# Patient Record
Sex: Female | Born: 2000 | Race: Black or African American | Hispanic: No | Marital: Single | Smoking: Never smoker
Health system: Southern US, Community
[De-identification: ages and names within clinical notes are randomized; demographics above are authoritative.]

---

## 2021-06-14 ENCOUNTER — Observation Stay: Admission: AD | Admit: 2021-06-14 | Payer: Self-pay | Admitting: Internal Medicine

## 2021-06-15 ENCOUNTER — Observation Stay (HOSPITAL_COMMUNITY): Payer: Medicaid Other

## 2021-06-15 ENCOUNTER — Other Ambulatory Visit: Payer: Self-pay

## 2021-06-15 ENCOUNTER — Observation Stay (HOSPITAL_COMMUNITY)
Admission: AD | Admit: 2021-06-15 | Discharge: 2021-06-16 | Disposition: A | Discharge status: Home / Self Care | Payer: Medicaid Other | Source: Other Acute Inpatient Hospital | Attending: Family Medicine | Admitting: Family Medicine

## 2021-06-15 ENCOUNTER — Encounter (HOSPITAL_COMMUNITY): Payer: Self-pay | Admitting: Internal Medicine

## 2021-06-15 DIAGNOSIS — K8020 Calculus of gallbladder without cholecystitis without obstruction: Principal | ICD-10-CM | POA: Insufficient documentation

## 2021-06-15 DIAGNOSIS — K831 Obstruction of bile duct: Secondary | ICD-10-CM | POA: Diagnosis present

## 2021-06-15 LAB — CBC WITH DIFFERENTIAL/PLATELET
Abs Immature Granulocytes: 0.01 10*3/uL (ref 0.00–0.07)
Basophils Absolute: 0 10*3/uL (ref 0.0–0.1)
Basophils Relative: 0 %
Eosinophils Absolute: 0.1 10*3/uL (ref 0.0–0.5)
Eosinophils Relative: 1 %
HCT: 36.2 % (ref 36.0–46.0)
Hemoglobin: 11.9 g/dL — ABNORMAL LOW (ref 12.0–15.0)
Immature Granulocytes: 0 %
Lymphocytes Relative: 29 %
Lymphs Abs: 1.4 10*3/uL (ref 0.7–4.0)
MCH: 26.9 pg (ref 26.0–34.0)
MCHC: 32.9 g/dL (ref 30.0–36.0)
MCV: 81.9 fL (ref 80.0–100.0)
Monocytes Absolute: 0.4 10*3/uL (ref 0.1–1.0)
Monocytes Relative: 8 %
Neutro Abs: 3 10*3/uL (ref 1.7–7.7)
Neutrophils Relative %: 62 %
Platelets: 293 10*3/uL (ref 150–400)
RBC: 4.42 MIL/uL (ref 3.87–5.11)
RDW: 13.7 % (ref 11.5–15.5)
WBC: 4.9 10*3/uL (ref 4.0–10.5)
nRBC: 0 % (ref 0.0–0.2)

## 2021-06-15 LAB — PROTIME-INR
INR: 1.1 (ref 0.8–1.2)
Prothrombin Time: 14.5 seconds (ref 11.4–15.2)

## 2021-06-15 LAB — HEPATITIS PANEL, ACUTE
HCV Ab: NONREACTIVE
Hep A IgM: NONREACTIVE
Hep B C IgM: NONREACTIVE
Hepatitis B Surface Ag: NONREACTIVE

## 2021-06-15 LAB — LIPASE, BLOOD: Lipase: 52 U/L — ABNORMAL HIGH (ref 11–51)

## 2021-06-15 LAB — COMPREHENSIVE METABOLIC PANEL
ALT: 178 U/L — ABNORMAL HIGH (ref 0–44)
AST: 113 U/L — ABNORMAL HIGH (ref 15–41)
Albumin: 3.4 g/dL — ABNORMAL LOW (ref 3.5–5.0)
Alkaline Phosphatase: 136 U/L — ABNORMAL HIGH (ref 38–126)
Anion gap: 8 (ref 5–15)
BUN: 8 mg/dL (ref 6–20)
CO2: 21 mmol/L — ABNORMAL LOW (ref 22–32)
Calcium: 8.5 mg/dL — ABNORMAL LOW (ref 8.9–10.3)
Chloride: 107 mmol/L (ref 98–111)
Creatinine, Ser: 0.65 mg/dL (ref 0.44–1.00)
GFR, Estimated: >60 mL/min (ref >60)
Glucose, Bld: 87 mg/dL (ref 70–99)
Potassium: 3.5 mmol/L (ref 3.5–5.1)
Sodium: 136 mmol/L (ref 135–145)
Total Bilirubin: 4 mg/dL — ABNORMAL HIGH (ref 0.3–1.2)
Total Protein: 6.5 g/dL (ref 6.5–8.1)

## 2021-06-15 LAB — HIV ANTIBODY (ROUTINE TESTING W REFLEX): HIV Screen 4th Generation wRfx: NONREACTIVE

## 2021-06-15 IMAGING — MR MR ABDOMEN WO/W CM MRCP
17 of 19 series · 42 of 48 positions shown · IV contrast (gadavist)
Comparison: CT of yesterday from [HOSPITAL] [facility]

CLINICAL DATA: Jaundiced

EXAM:
MRI ABDOMEN WITHOUT AND WITH CONTRAST (INCLUDING MRCP)
TECHNIQUE: Multiplanar multisequence MR imaging of the abdomen was performed
both before and after the administration of intravenous contrast.
Heavily T2-weighted images of the biliary and pancreatic ducts were
obtained, and three-dimensional MRCP images were rendered by post
processing.
CONTRAST:  7mL GADAVIST GADOBUTROL 1 MMOL/ML IV SOLN

[Series 3: T2 fat-sat · axial · 6.0mm · 1.25mm/px · z∈[-69,+183]mm · 2 of 36 slices shown]
[im 1/36]
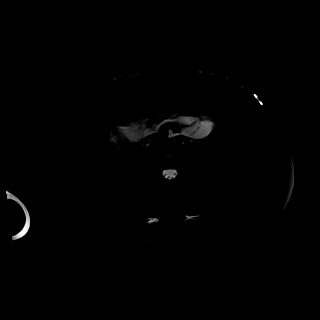
[im 36/36]
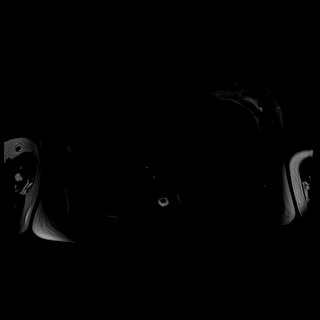

[Series 6: T2 · coronal · 6.0mm · 1.48mm/px · 2 of 28 slices shown (1 of 2)]
[im 1/28]
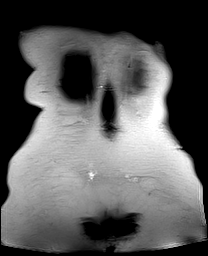
[im 28/28]
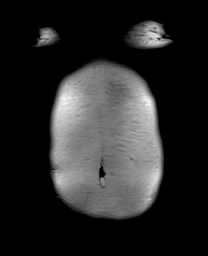

[Series 8: DWI · axial · 6.0mm · 1.49mm/px · z∈[-66,+186]mm · 3 of 72 slices shown (1 of 2)]
[im 1/72]
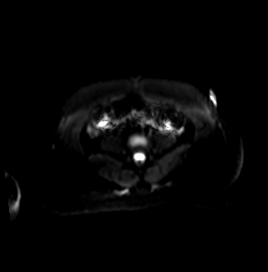
[im 36/72]
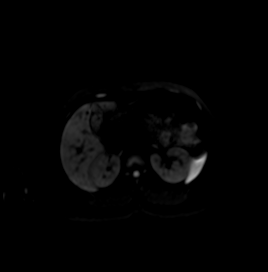
[im 72/72]
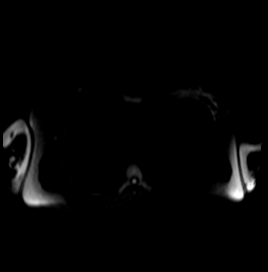

[Series 9: DWI · axial · 6.0mm · 1.49mm/px · 1 of 36 slices shown (2 of 2)]
[im 1/36]
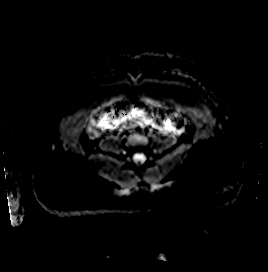

[Series 10: T1 · axial · 3.0mm · 1.25mm/px · z∈[-71,+142]mm · 3 of 72 slices shown (1 of 2)]
[im 1/72]
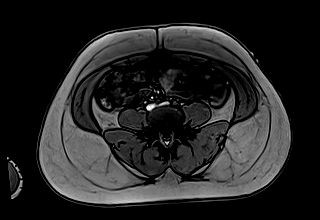
[im 36/72]
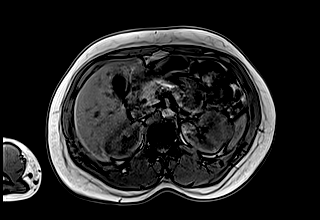
[im 72/72]
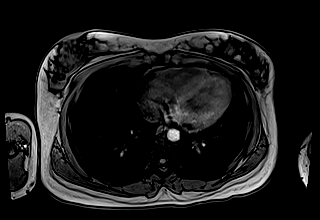

[Series 11: T1 · axial · 3.0mm · 1.25mm/px · z∈[-71,+142]mm · 3 of 72 slices shown (2 of 2)]
[im 1/72]
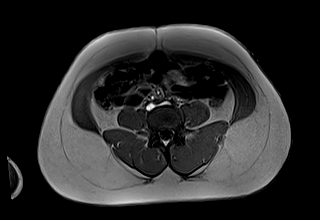
[im 36/72]
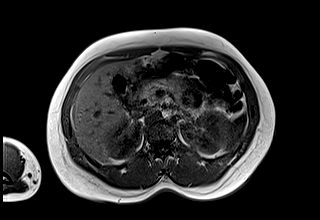
[im 72/72]
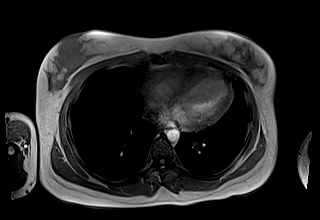

[Series 12: cor obl thk · sagittal · 50.0mm · 0.78mm/px · 1 of 9 slices shown]
[im 1/9]
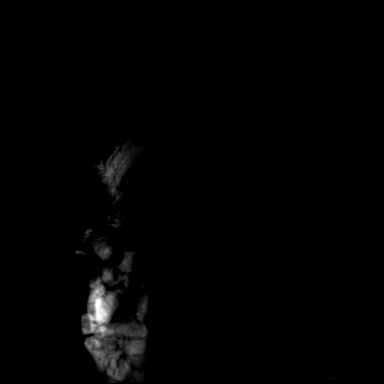

[Series 14: cor_3d_spc_trig · coronal · 1.0mm · 0.49mm/px · 3 of 72 slices shown]
[im 1/72]
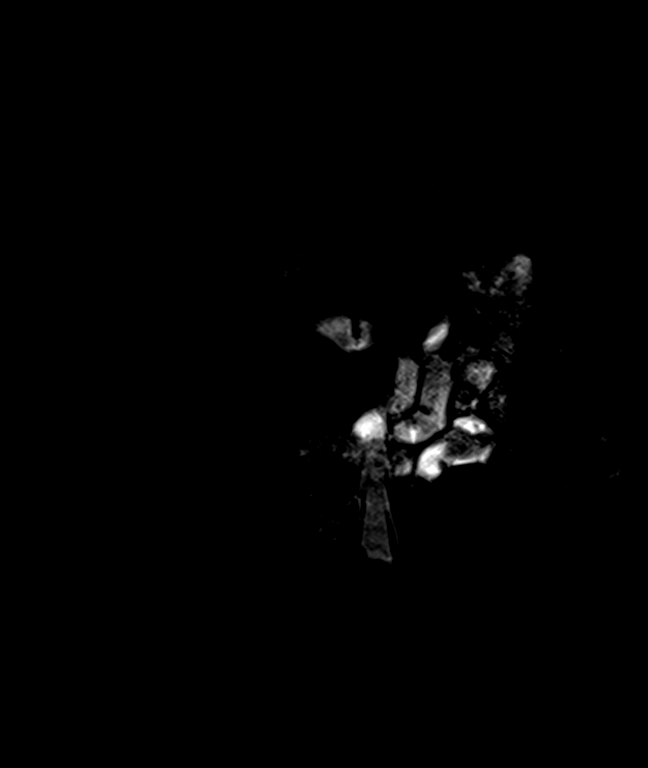
[im 36/72]
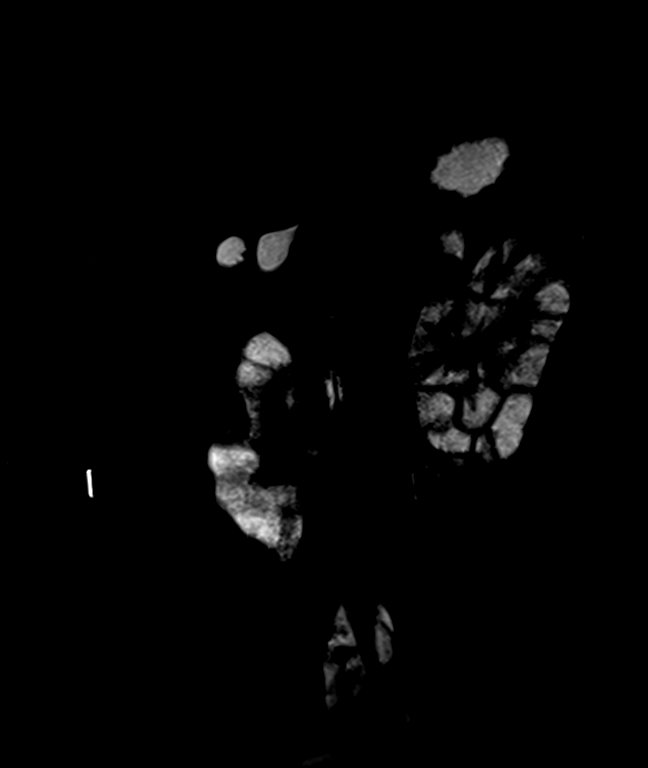
[im 72/72]
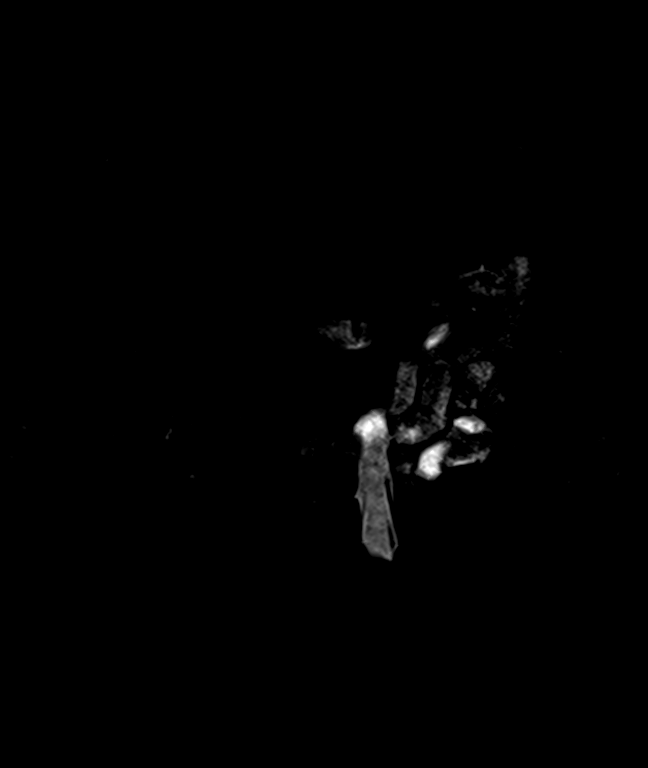

[Series 16: T2 · axial · 6.0mm · 1.56mm/px · 1 of 30 slices shown (2 of 2)]
[im 1/30]
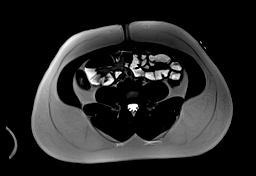

[Series 18: T1 dynamic · axial · 3.0mm · 1.25mm/px · z∈[-83,+154]mm · 3 of 80 slices shown (1 of 6)]
[im 1/80]
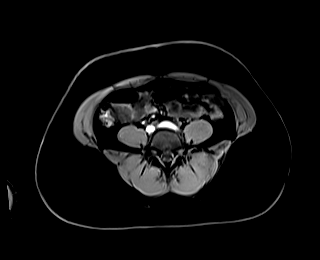
[im 40/80]
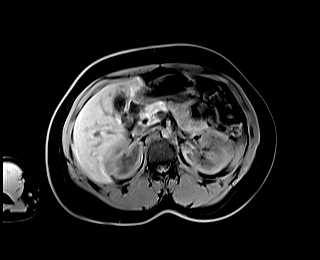
[im 80/80]
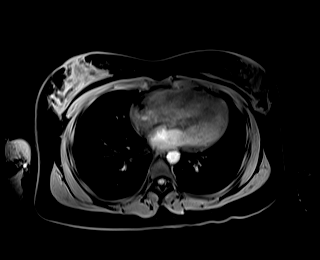

[Series 21: T1 dynamic · axial · 3.0mm · 1.25mm/px · z∈[-83,+154]mm · 3 of 80 slices shown (2 of 6)]
[im 1/80]
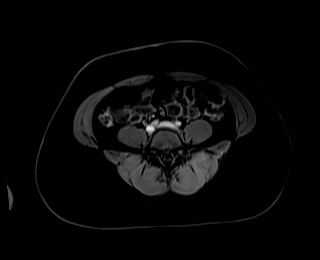
[im 40/80]
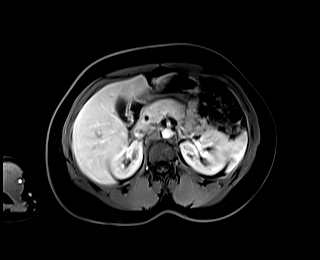
[im 80/80]
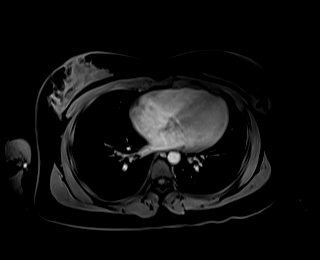

[Series 23: T1 dynamic · axial · 3.0mm · 1.25mm/px · z∈[-83,+154]mm · 3 of 80 slices shown (3 of 6)]
[im 1/80]
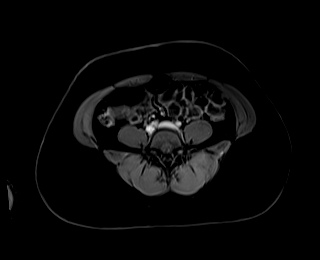
[im 40/80]
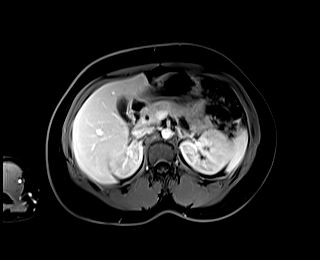
[im 80/80]
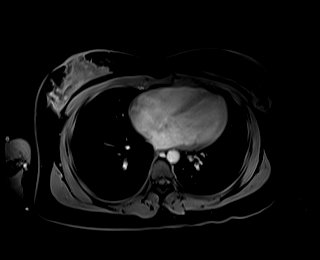

[Series 25: T1 dynamic · axial · 3.0mm · 1.25mm/px · z∈[-83,+154]mm · 3 of 80 slices shown (4 of 6)]
[im 1/80]
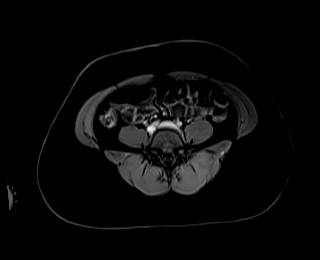
[im 40/80]
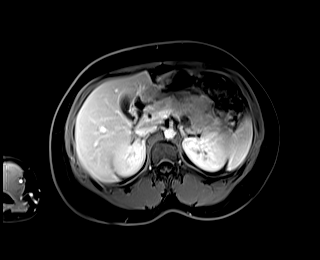
[im 80/80]
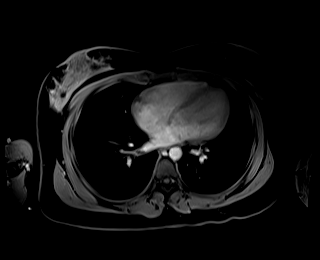

[Series 27: T1 dynamic · coronal · 3.0mm · 1.41mm/px · 3 of 72 slices shown (5 of 6)]
[im 1/72]
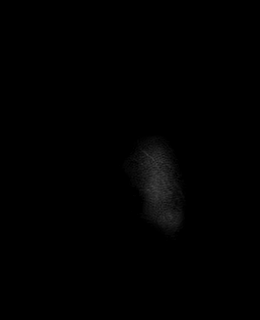
[im 36/72]
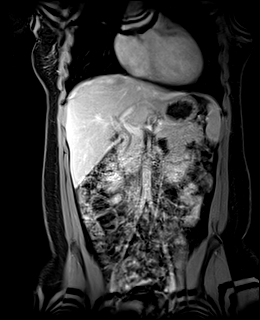
[im 72/72]
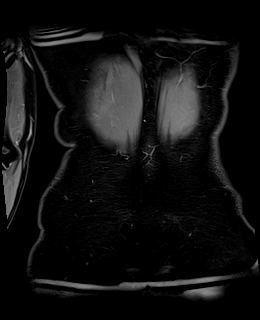

[Series 29: T1 dynamic · axial · 3.0mm · 1.25mm/px · z∈[-83,+154]mm · 3 of 80 slices shown (6 of 6)]
[im 1/80]
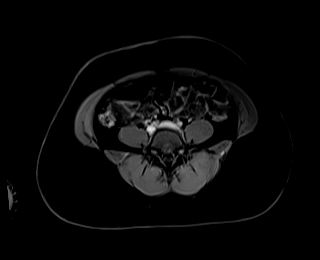
[im 40/80]
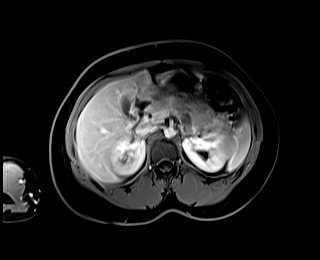
[im 80/80]
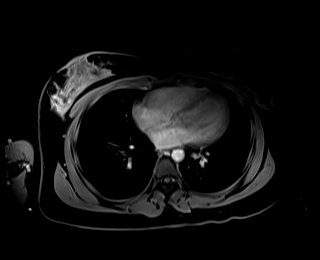

[Series 100: 20 sec sub · axial · 3.0mm · 1.25mm/px · z∈[-83,+154]mm · 3 of 80 slices shown]
[im 1/80]
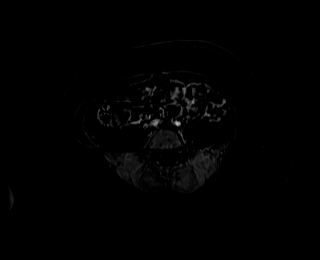
[im 40/80]
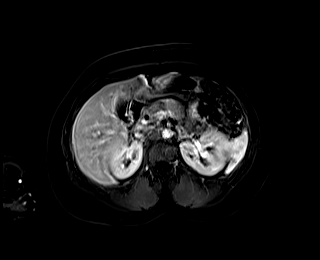
[im 80/80]
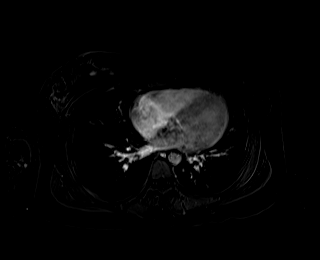

[Series 101: 45 sec sub · axial · 3.0mm · 1.25mm/px · z∈[-83,+34]mm · 2 of 80 slices shown]
[im 1/80]
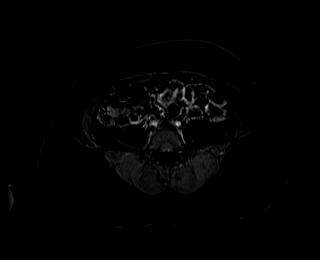
[im 40/80]
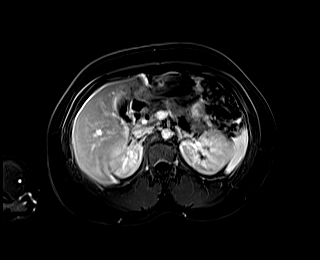

[42 of 48 positions shown; findings below may reference images not displayed]

FINDINGS: Portions of exam are mildly motion degraded.

Lower chest: Normal heart size without pericardial or pleural
effusion.

Hepatobiliary: No evidence of cirrhosis or significant steatosis.
There is a 2 mm cyst in the right hepatic lobe.

The intrahepatic biliary duct dilatation is felt to be slightly
improved, minimal.

Multiple tiny gallstones including on [DATE]. No evidence of acute
cholecystitis.

The previously described mild biliary duct dilatation is improved to
resolved. For example, the common duct measures 4 mm in the porta
hepatis on [DATE] versus 7 mm when measured in a similar fashion on
the prior CT. No evidence of choledocholithiasis or obstructive
mass.

Pancreas:  Normal, without mass or ductal dilatation.

Spleen:  Normal in size, without focal abnormality.

Adrenals/Urinary Tract: Normal adrenal glands. Normal kidneys,
without hydronephrosis.

Stomach/Bowel: Normal stomach and abdominal bowel loops.

Vascular/Lymphatic: Normal caliber of the aorta and branch vessels.
No retroperitoneal or retrocrural adenopathy.

Other:  No ascites.

Musculoskeletal: No acute osseous abnormality.
IMPRESSION: 1. Mildly motion degraded exam.
2. Cholelithiasis. The intra and extrahepatic biliary duct
dilatation is improved to resolved, as detailed above. Question
interval stone passage.
3. No other explanation for jaundice.

## 2021-06-15 IMAGING — MR MR 3D RECON AT [facility]
18 of 21 series · 42 of 48 positions shown · IV contrast (gadavist)
Comparison: CT of yesterday from [HOSPITAL] [facility]

CLINICAL DATA: Jaundiced

EXAM:
MRI ABDOMEN WITHOUT AND WITH CONTRAST (INCLUDING MRCP)
TECHNIQUE: Multiplanar multisequence MR imaging of the abdomen was performed
both before and after the administration of intravenous contrast.
Heavily T2-weighted images of the biliary and pancreatic ducts were
obtained, and three-dimensional MRCP images were rendered by post
processing.
CONTRAST:  7mL GADAVIST GADOBUTROL 1 MMOL/ML IV SOLN

[Series 4: T2 fat-sat · axial · 6.0mm · 1.25mm/px · 1 of 36 slices shown]
[im 1/36]
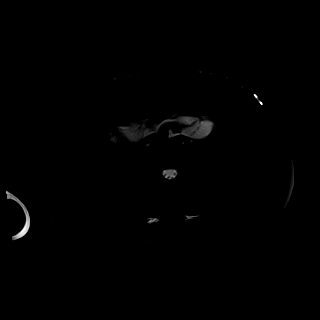

[Series 7: T2 · coronal · 6.0mm · 1.48mm/px · 1 of 28 slices shown (1 of 2)]
[im 1/28]
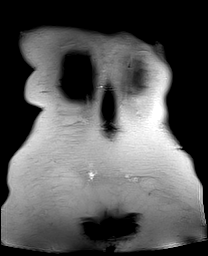

[Series 10: DWI · axial · 6.0mm · 1.49mm/px · z∈[-66,+186]mm · 3 of 72 slices shown (1 of 2)]
[im 1/72]
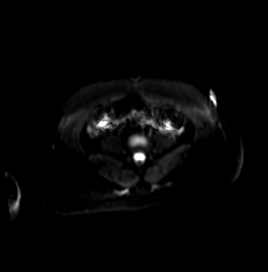
[im 36/72]
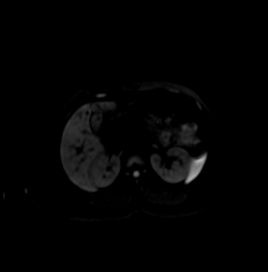
[im 72/72]
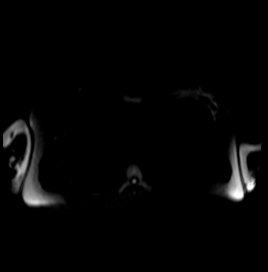

[Series 11: DWI · axial · 6.0mm · 1.49mm/px · 1 of 36 slices shown (2 of 2)]
[im 1/36]
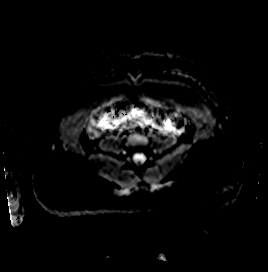

[Series 12: T1 · axial · 3.0mm · 1.25mm/px · z∈[-71,+142]mm · 3 of 72 slices shown (1 of 2)]
[im 1/72]
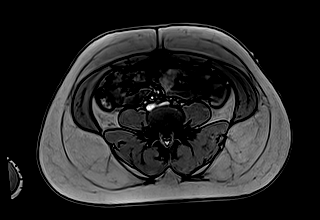
[im 36/72]
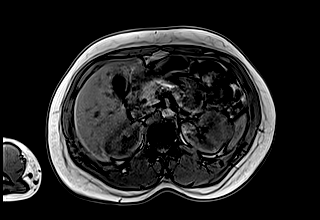
[im 72/72]
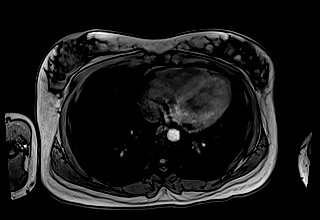

[Series 13: T1 · axial · 3.0mm · 1.25mm/px · z∈[-71,+142]mm · 3 of 72 slices shown (2 of 2)]
[im 1/72]
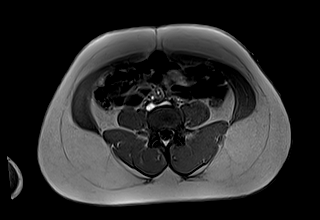
[im 36/72]
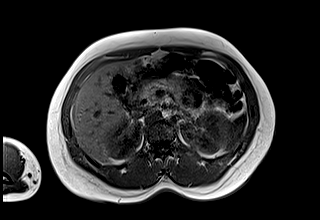
[im 72/72]
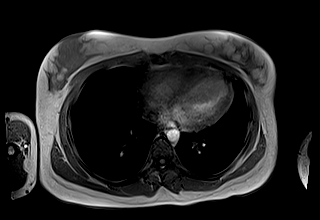

[Series 14: cor obl thk · sagittal · 50.0mm · 0.78mm/px · 1 of 9 slices shown]
[im 1/9]
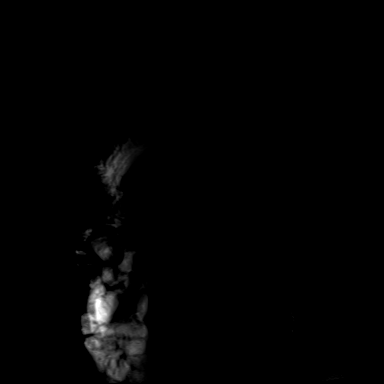

[Series 16: cor_3d_spc_trig · coronal · 1.0mm · 0.49mm/px · 3 of 72 slices shown]
[im 1/72]
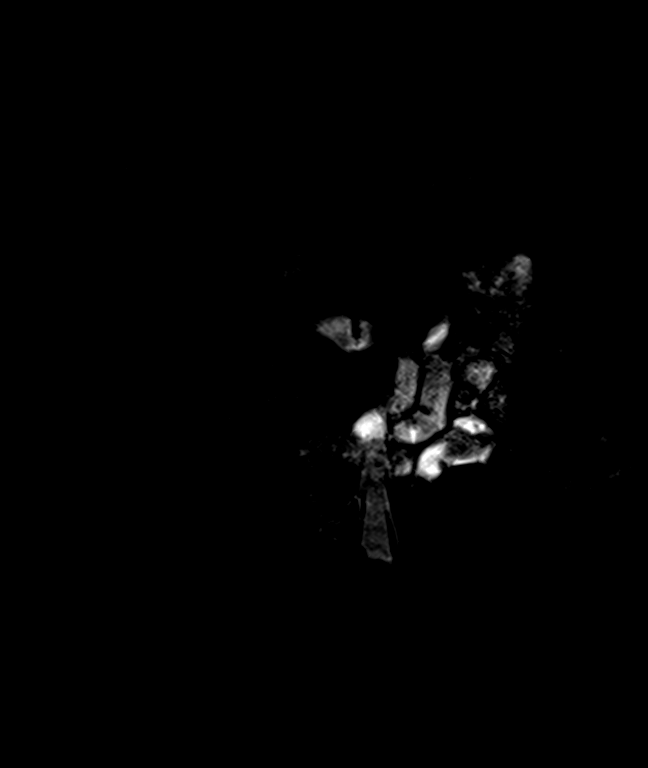
[im 36/72]
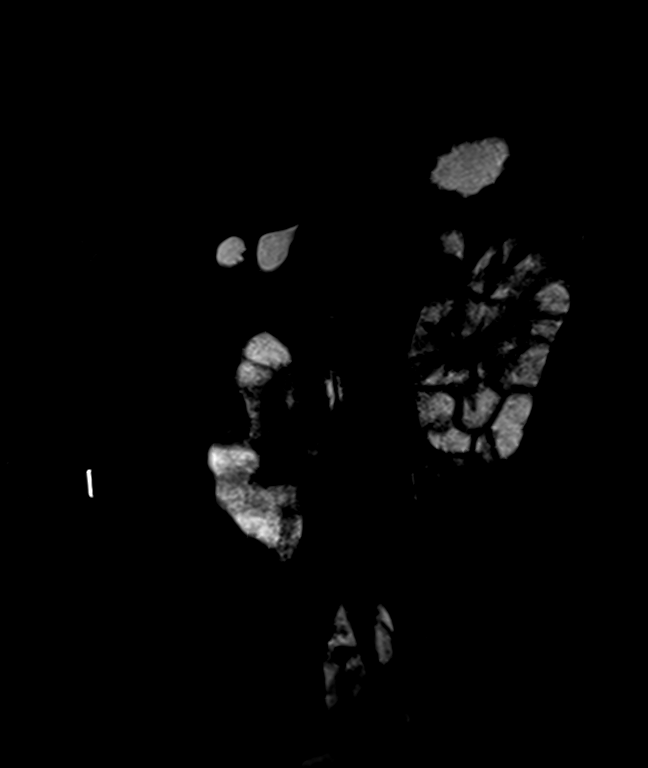
[im 72/72]
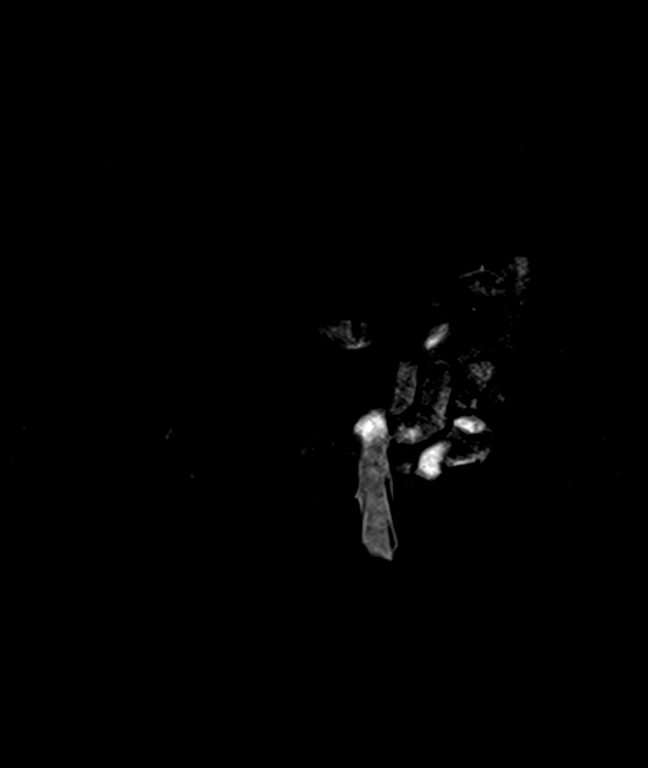

[Series 18: T2 · axial · 6.0mm · 1.56mm/px · 1 of 30 slices shown (2 of 2)]
[im 1/30]
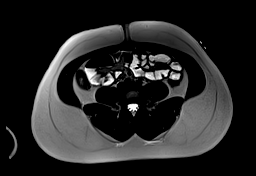

[Series 20: T1 dynamic · axial · 3.0mm · 1.25mm/px · z∈[-83,+154]mm · 3 of 80 slices shown (1 of 6)]
[im 1/80]
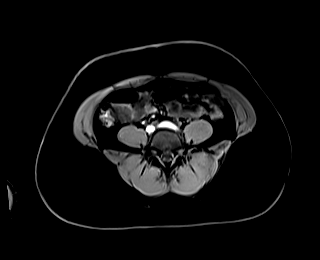
[im 40/80]
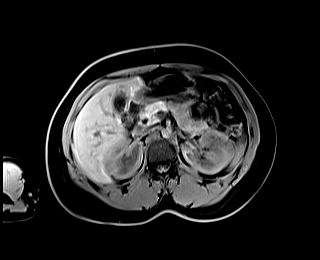
[im 80/80]
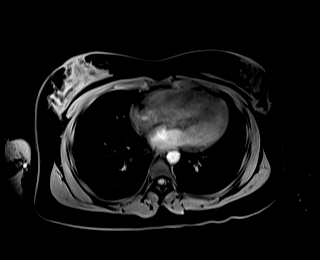

[Series 23: T1 dynamic · axial · 3.0mm · 1.25mm/px · z∈[-83,+154]mm · 3 of 80 slices shown (2 of 6)]
[im 1/80]
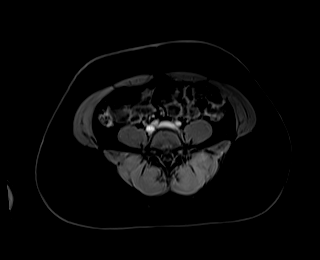
[im 40/80]
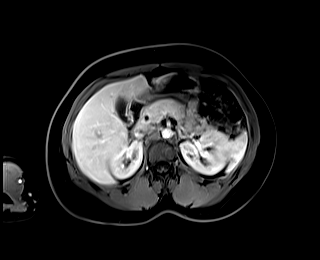
[im 80/80]
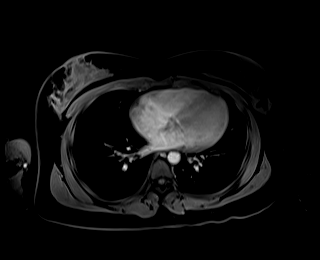

[Series 25: T1 dynamic · axial · 3.0mm · 1.25mm/px · z∈[-83,+154]mm · 3 of 80 slices shown (3 of 6)]
[im 1/80]
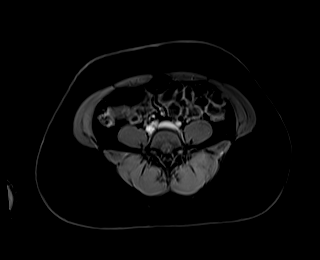
[im 40/80]
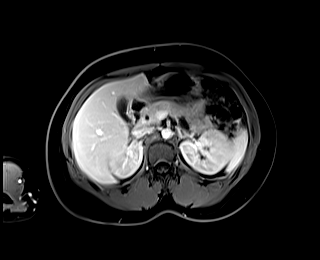
[im 80/80]
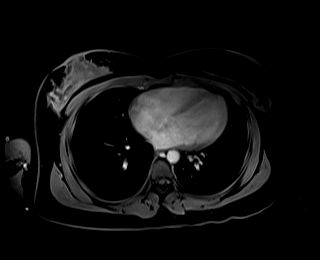

[Series 27: T1 dynamic · axial · 3.0mm · 1.25mm/px · z∈[-83,+154]mm · 3 of 80 slices shown (4 of 6)]
[im 1/80]
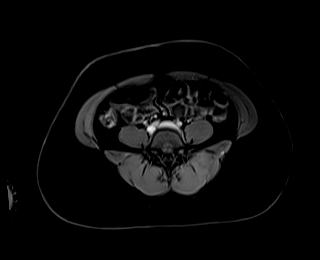
[im 40/80]
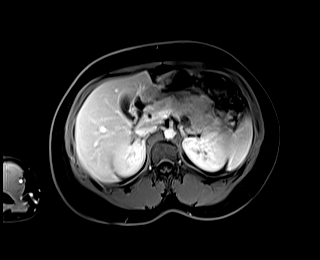
[im 80/80]
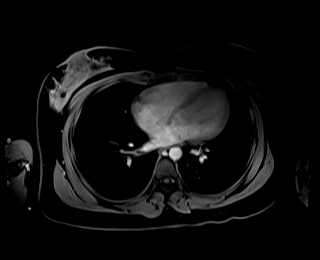

[Series 29: T1 dynamic · coronal · 3.0mm · 1.41mm/px · 3 of 72 slices shown (5 of 6)]
[im 1/72]
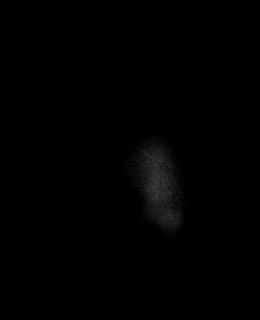
[im 36/72]
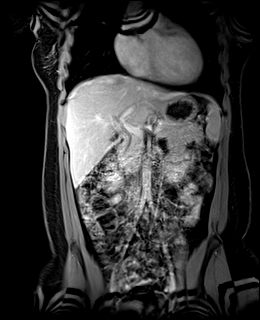
[im 72/72]
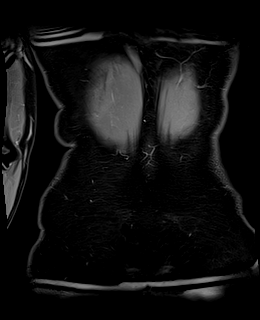

[Series 31: T1 dynamic · axial · 3.0mm · 1.25mm/px · z∈[-83,+154]mm · 3 of 80 slices shown (6 of 6)]
[im 1/80]
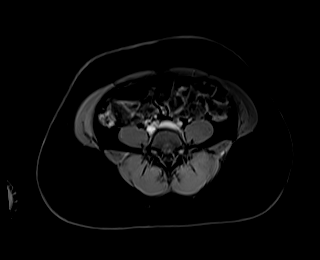
[im 40/80]
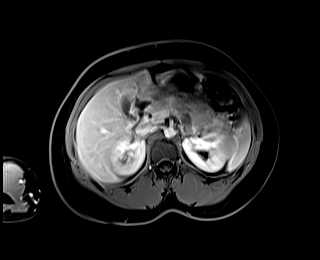
[im 80/80]
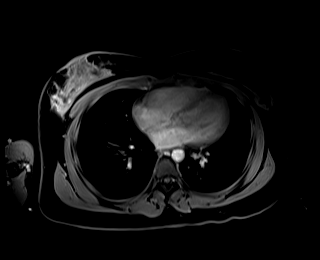

[Series 102: 20 sec sub · axial · 3.0mm · 1.25mm/px · z∈[-83,+154]mm · 3 of 80 slices shown]
[im 1/80]
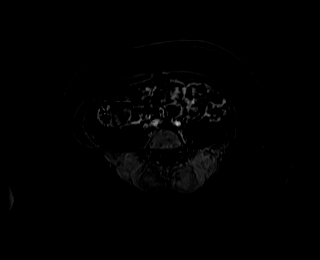
[im 40/80]
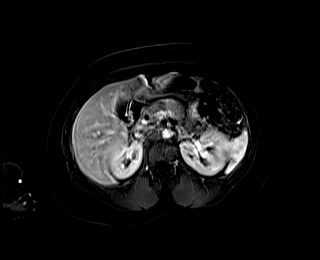
[im 80/80]
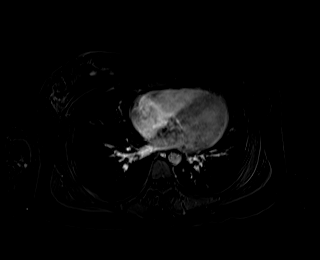

[Series 103: 45 sec sub · axial · 3.0mm · 1.25mm/px · z∈[-83,+154]mm · 3 of 80 slices shown]
[im 1/80]
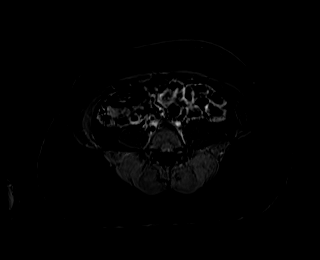
[im 40/80]
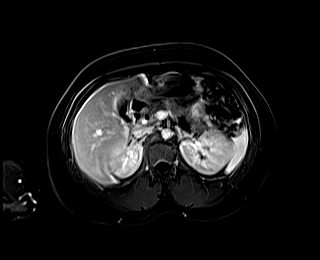
[im 80/80]
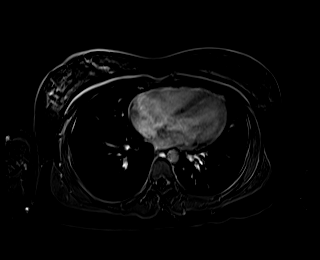

[Series 104: 90 sec sub · axial · 3.0mm · 1.25mm/px · 1 of 80 slices shown]
[im 1/80]
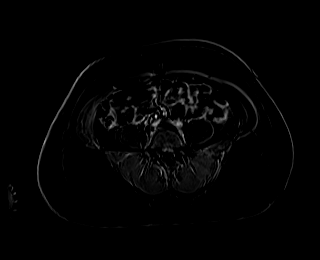

[42 of 48 positions shown; findings below may reference images not displayed]

FINDINGS: Portions of exam are mildly motion degraded.

Lower chest: Normal heart size without pericardial or pleural
effusion.

Hepatobiliary: No evidence of cirrhosis or significant steatosis.
There is a 2 mm cyst in the right hepatic lobe.

The intrahepatic biliary duct dilatation is felt to be slightly
improved, minimal.

Multiple tiny gallstones including on [DATE]. No evidence of acute
cholecystitis.

The previously described mild biliary duct dilatation is improved to
resolved. For example, the common duct measures 4 mm in the porta
hepatis on [DATE] versus 7 mm when measured in a similar fashion on
the prior CT. No evidence of choledocholithiasis or obstructive
mass.

Pancreas:  Normal, without mass or ductal dilatation.

Spleen:  Normal in size, without focal abnormality.

Adrenals/Urinary Tract: Normal adrenal glands. Normal kidneys,
without hydronephrosis.

Stomach/Bowel: Normal stomach and abdominal bowel loops.

Vascular/Lymphatic: Normal caliber of the aorta and branch vessels.
No retroperitoneal or retrocrural adenopathy.

Other:  No ascites.

Musculoskeletal: No acute osseous abnormality.
IMPRESSION: 1. Mildly motion degraded exam.
2. Cholelithiasis. The intra and extrahepatic biliary duct
dilatation is improved to resolved, as detailed above. Question
interval stone passage.
3. No other explanation for jaundice.

## 2021-06-15 MED ORDER — GADOBUTROL 1 MMOL/ML IV SOLN
7.0000 mL | Freq: Once | INTRAVENOUS | Status: AC | PRN
  Administered 2021-06-15: 7 mL via INTRAVENOUS

## 2021-06-15 MED ORDER — LACTATED RINGERS IV SOLN: INTRAVENOUS | Status: DC

## 2021-06-15 MED ORDER — ONDANSETRON HCL 4 MG/2ML IJ SOLN: 4.0000 mg | Freq: Four times a day (QID) | INTRAMUSCULAR | Status: DC | PRN

## 2021-06-15 MED ORDER — ONDANSETRON HCL 4 MG PO TABS: 4.0000 mg | ORAL_TABLET | Freq: Four times a day (QID) | ORAL | Status: DC | PRN

## 2021-06-15 NOTE — Consult Note (Signed)
Referring Provider: [facility] ?Primary Care Physician:  Pcp, No ?Primary Gastroenterologist:  Unassigned ? ?Reason for Consultation:  abdominal pain, jaundice ? ?HPI: Lisa Bartlett is a 21 y.o. female with no significant past medical history who presented to the ED for scleral icterus and RUQ pain.  ? ?Saturday she woke up at 3 am with RUQ pain. She notes she went to the bathroom and saw blood in her urine and it was a darker color. She notes associated nausea and vomiting. She had several episodes of emesis prior to presentation of the ED that contained stomach contents, no blood or coffee ground emesis. She states the pain was similar to her previous c-section. She was transferred from an outside hospital ED [facility]  ? ?She denies pain episodes previously that are similar to this.  ? ?She Denies family history of GI malignancy or gallbladder issues.   ? ? She denies fever, chills, rectal bleeding, diarrhea and constipation.  ? ?Denies any new medications, alcohol, drugs use, tylenol. ? ?Today she notes she still has red specs of blood in her urine. Her RUQ pain is improved from yesterday. She denies any further episodes of emesis. Her nausea is improved.  ? ?History reviewed. No pertinent past medical history. ? ?Past Surgical History:  ?Procedure Laterality Date  ? CESAREAN SECTION  2022  ? ? ?Prior to Admission medications   ?Not on File  ? ? ?Scheduled Meds: ?Continuous Infusions: ? lactated ringers 75 mL/hr at 06/15/21 0252  ? ?PRN Meds:.ondansetron **OR** ondansetron (ZOFRAN) IV ? ?Allergies as of 06/14/2021  ? (Not on File)  ? ? ?Family History  ?Problem Relation Age of Onset  ? Liver disease Neg Hx   ? ? ?Social History  ? ?Socioeconomic History  ? Marital status: Single  ?  Spouse name: Not on file  ? Number of children: Not on file  ? Years of education: Not on file  ? Highest education level: Not on file  ?Occupational History  ? Not on file  ?Tobacco Use  ?  Smoking status: Never  ? Smokeless tobacco: Not on file  ?Substance and Sexual Activity  ? Alcohol use: Never  ? Drug use: Never  ? Sexual activity: Not on file  ?Other Topics Concern  ? Not on file  ?Social History Narrative  ? Not on file  ? ?Social Determinants of Health  ? ?Financial Resource Strain: Not on file  ?Food Insecurity: Not on file  ?Transportation Needs: Not on file  ?Physical Activity: Not on file  ?Stress: Not on file  ?Social Connections: Not on file  ?Intimate Partner Violence: Not on file  ? ? ?Review of Systems:Review of Systems  ?Constitutional:  Negative for chills, fever and weight loss.  ?HENT:  Negative for hearing loss and tinnitus.   ?Eyes:  Negative for blurred vision and double vision.  ?Respiratory:  Negative for cough and shortness of breath.   ?Cardiovascular:  Negative for chest pain and palpitations.  ?Gastrointestinal:  Positive for abdominal pain, nausea and vomiting. Negative for blood in stool, constipation, diarrhea, heartburn and melena.  ?Genitourinary:  Positive for hematuria. Negative for dysuria.  ?Musculoskeletal:  Negative for myalgias and neck pain.  ?Skin:  Negative for itching and rash.  ?Neurological:  Negative for dizziness and headaches.  ?Endo/Heme/Allergies:  Negative for environmental allergies and polydipsia.  ?Psychiatric/Behavioral:  Negative for depression and substance abuse.   ? ? ?Physical Exam:Physical Exam ?Constitutional:   ?  General: She is not in acute distress. ?   Appearance: Normal appearance. She is normal weight. She is not toxic-appearing.  ?HENT:  ?   Head: Normocephalic and atraumatic.  ?   Right Ear: External ear normal.  ?   Left Ear: External ear normal.  ?   Nose: Nose normal.  ?   Mouth/Throat:  ?   Mouth: Mucous membranes are dry.  ?   Pharynx: Oropharynx is clear.  ?Eyes:  ?   General: Scleral icterus (mild) present.  ?   Pupils: Pupils are equal, round, and reactive to light.  ?Cardiovascular:  ?   Rate and Rhythm: Normal rate and  regular rhythm.  ?   Pulses: Normal pulses.  ?   Heart sounds: Normal heart sounds. No murmur heard. ?  No friction rub. No gallop.  ?Pulmonary:  ?   Effort: Pulmonary effort is normal. No respiratory distress.  ?   Breath sounds: Normal breath sounds. No stridor. No wheezing or rhonchi.  ?Abdominal:  ?   General: Abdomen is flat. Bowel sounds are normal. There is no distension.  ?   Palpations: Abdomen is soft. There is no mass.  ?   Tenderness: There is abdominal tenderness (mild RUQ tenderness to palpation). There is no guarding or rebound.  ?   Hernia: No hernia is present.  ?Musculoskeletal:     ?   General: Normal range of motion.  ?   Cervical back: Normal range of motion and neck supple.  ?Skin: ?   General: Skin is warm and dry.  ?Neurological:  ?   General: No focal deficit present.  ?   Mental Status: She is alert and oriented to person, place, and time.  ?Psychiatric:     ?   Mood and Affect: Mood normal.     ?   Behavior: Behavior normal.  ?  ?Physical Activity: Not on file  ? exam ?Vital signs: ?Vitals:  ? 06/15/21 0238 06/15/21 0636  ?BP: 110/67 119/69  ?Pulse: (!) 59 68  ?Resp: 16 18  ?Temp: 98.7 ?F (37.1 ?C) 98.2 ?F (36.8 ?C)  ?SpO2: 96% 97%  ? ?  ? ? ? ?GI:  ?Lab Results: ?Recent Labs  ?  06/15/21 ?0458  ?WBC 4.9  ?HGB 11.9*  ?HCT 36.2  ?PLT 293  ? ?BMET ?Recent Labs  ?  06/15/21 ?0458  ?NA 136  ?K 3.5  ?CL 107  ?CO2 21*  ?GLUCOSE 87  ?BUN 8  ?CREATININE 0.65  ?CALCIUM 8.5*  ? ?LFT ?Recent Labs  ?  06/15/21 ?0458  ?PROT 6.5  ?ALBUMIN 3.4*  ?AST 113*  ?ALT 178*  ?ALKPHOS 136*  ?BILITOT 4.0*  ? ?PT/INR ?Recent Labs  ?  06/15/21 ?0458  ?LABPROT 14.5  ?INR 1.1  ? ? ? ?Studies/Results: ?No results found. ? ?Impression: ?RUQ pain ?Jaundice ?Intrahepatic duct dilation  ? ?HGB 11.9 Platelets 293 ?AST 113 ALT 178  ?Alkphos 136 TBili 4.0 ?GFR >60  ?INR 06/15/2021 1.1  ? ?Outside hospital CT AP significant for intrahepatic duct dilation.  ?Findings consistent with obstructive jaundice. Possible due to  gallstones, sludge or possible malignancy.  ? ?Plan: ?MRCP planned for this afternoon, if choledocholithiasis seen will plan for ERCP.  ?Will evaluate for fever and chills and elevated WBC that may signify ascending cholangitis will treat promptly with IV abx and ERCP.  ?Evaluating for possible gallstone pancreatitis with lipase and MRCP. ?Continue to trend liver enzymes.  ?Continue IV fluids.  ?May trial liquid diet,  if procedure scheduled will need NPO at midnight.  ?[facility] will follow ? LOS: 0 days  ? ?Emmit Alexanders  PA-C ?06/15/2021, 8:07 AM ? ?Contact #  [phone]  ?

## 2021-06-15 NOTE — Assessment & Plan Note (Addendum)
Patient with jaundice and intrahepatic ductal dilation. Elevated AST/ALT/Bilriubin and ALP on admission. GI consult. Hepatitis panel negative. MRCP ordered and is significant for no evidence of choledocholithiasis, suggesting likely passage of biliary stone. GI recommending outpatient follow-up with general surgery. General surgery have scheduled an appointment for follow-up. Repeat CMP with improved AST/ALT/Bilirubin. Patient states she is in the process of establishing care with a PCP in [place]. ?

## 2021-06-15 NOTE — Progress Notes (Signed)
? ?  Patient seen and examined at bedside, patient admitted after midnight, please see earlier detailed admission note by Hillary Bow, DO. Briefly, patient presented secondary to abdominal pain, nausea and vomiting. Patient found to have evidence of obstructive jaundice. ? ?Subjective: ?Patient reports resolution of pain symptoms. No issues of nausea or vomiting. ? ?BP 123/72 (BP Location: Right Arm)   Pulse (!) 52   Temp 98.5 ?F (36.9 ?C) (Oral)   Resp 16   Ht 5\' 1"  (1.549 m)   Wt 73.8 kg   SpO2 98%   BMI 30.74 kg/m?  ? ?General exam: Appears calm and comfortable ?Respiratory system: Clear to auscultation. Respiratory effort normal. ?Cardiovascular system: S1 & S2 heard, RRR. No murmurs, rubs, gallops or clicks. ?Gastrointestinal system: Abdomen is nondistended, soft and nontender. No organomegaly or masses felt. Normal bowel sounds heard. ?Central nervous system: Alert and oriented. No focal neurological deficits. ?Musculoskeletal: No edema. No calf tenderness ?Skin: No cyanosis. No rashes ?Psychiatry: Judgement and insight appear normal. Mood & affect appropriate.  ? ?Brief assessment/Plan: ? ?Assessment and Plan: ?* Obstructive jaundice ?Patient with jaundice and intrahepatic ductal dilation. Elevated AST/ALT/Bilriubin and ALP on admission. GI consult. Hepatitis panel negative. MRCP ordered and are pending ?-GI recommendations: awaiting MRCP ?-CMP in AM ? ? ?Family communication: Boyfriend at bedside ?DVT prophylaxis: SCDs ?Disposition: Discharge home likely in 1-2 days ? ? , MD ?[facility] ?06/15/2021, 1:03 PM ?

## 2021-06-15 NOTE — H&P (Addendum)
?History and Physical  ? ? ?Patient: Lisa Bartlett BEM:[id number] DOB: 2000-06-02 ?DOA: 06/15/2021 ?DOS: the patient was seen and examined on 06/15/2021 ?PCP: Pcp, No  ?Patient coming from: Outside Hospital ? ?Chief Complaint: No chief complaint on file. ? ?HPI: Lisa Bartlett is a 21 y.o. female with medical history significant of previously healthy. ? ?Pt presents to OSH with painless jaundice.  Noticed her sclera becoming icteric so she presented to ED. ? ?No abd pain, no N/V, no fever, no use of tylenol nor alcohol. ? ?Review of medical history is negative for risk factors.  C.Section 5 months ago and that's about it.  ?Review of Systems: As mentioned in the history of present illness. All other systems reviewed and are negative. ?History reviewed. No pertinent past medical history. ?Past Surgical History:  ?Procedure Laterality Date  ? CESAREAN SECTION  2022  ? ?Social History:  reports that she has never smoked. She does not have any smokeless tobacco history on file. She reports that she does not drink alcohol and does not use drugs. ? ?No Known Allergies ? ?Family History  ?Problem Relation Age of Onset  ? Liver disease Neg Hx   ? ? ?Prior to Admission medications   ?Not on File  ? ? ?Physical Exam: ?Vitals:  ? 06/15/21 0200 06/15/21 0238  ?BP:  110/67  ?Pulse:  (!) 59  ?Resp:  16  ?Temp:  98.7 ?F (37.1 ?C)  ?TempSrc:  Oral  ?SpO2:  96%  ?Weight: 73.8 kg   ?Height: 5' 1"  (1.549 m)   ? ?Constitutional: NAD, calm, comfortable ?Eyes: PERRL, lids and conjunctivae icteric ?ENMT: Mucous membranes are moist. Posterior pharynx clear of any exudate or lesions.Normal dentition.  ?Neck: normal, supple, no masses, no thyromegaly ?Respiratory: clear to auscultation bilaterally, no wheezing, no crackles. Normal respiratory effort. No accessory muscle use.  ?Cardiovascular: Regular rate and rhythm, no murmurs / rubs / gallops. No extremity edema. 2+ pedal pulses. No carotid bruits.  ?Abdomen: no tenderness, no masses  palpated. No hepatosplenomegaly. Bowel sounds positive.  ?Musculoskeletal: no clubbing / cyanosis. No joint deformity upper and lower extremities. Good ROM, no contractures. Normal muscle tone.  ?Skin: no rashes, lesions, ulcers. No induration ?Neurologic: CN 2-12 grossly intact. Sensation intact, DTR normal. Strength 5/5 in all 4.  ?Psychiatric: Normal judgment and insight. Alert and oriented x 3. Normal mood.  ? ?Data Reviewed: ? ?OSH CT AP showed hepatic intraductal dilation. ?AST 192, ALT 291, ALK 190, T.bili 5.4 ? ?Assessment and Plan: ?* Obstructive jaundice ?Jaundice, intrahepatic duct dilation. ?Repeat labs here ?MRCP ordered ?NPO ?IVF ?Dr. Havery Moros aware of pt transfer. ?Hepatitis pnl ordered ?Im going to hold off on ordering any other autoimmune lab work up for the moment and defer to GI. ? ? ? ? ? Advance Care Planning:   Code Status: Full Code ? ?Consults: Dr. Havery Moros (GI consult for AM) ? ?Family Communication: No family in room ? ?Severity of Illness: ?The appropriate patient status for this patient is OBSERVATION. Observation status is judged to be reasonable and necessary in order to provide the required intensity of service to ensure the patient's safety. The patient's presenting symptoms, physical exam findings, and initial radiographic and laboratory data in the context of their medical condition is felt to place them at decreased risk for further clinical deterioration. Furthermore, it is anticipated that the patient will be medically stable for discharge from the hospital within 2 midnights of admission.  ? ?Author: ?Etta Quill., DO ?06/15/2021 3:27  AM ? ?For on call review [email or web address].  ?

## 2021-06-16 DIAGNOSIS — K8020 Calculus of gallbladder without cholecystitis without obstruction: Secondary | ICD-10-CM | POA: Diagnosis not present

## 2021-06-16 DIAGNOSIS — K831 Obstruction of bile duct: Secondary | ICD-10-CM | POA: Diagnosis not present

## 2021-06-16 LAB — COMPREHENSIVE METABOLIC PANEL
ALT: 130 U/L — ABNORMAL HIGH (ref 0–44)
AST: 47 U/L — ABNORMAL HIGH (ref 15–41)
Albumin: 3.6 g/dL (ref 3.5–5.0)
Alkaline Phosphatase: 123 U/L (ref 38–126)
Anion gap: 7 (ref 5–15)
BUN: 8 mg/dL (ref 6–20)
CO2: 24 mmol/L (ref 22–32)
Calcium: 8.8 mg/dL — ABNORMAL LOW (ref 8.9–10.3)
Chloride: 105 mmol/L (ref 98–111)
Creatinine, Ser: 0.64 mg/dL (ref 0.44–1.00)
GFR, Estimated: >60 mL/min (ref >60)
Glucose, Bld: 83 mg/dL (ref 70–99)
Potassium: 3.9 mmol/L (ref 3.5–5.1)
Sodium: 136 mmol/L (ref 135–145)
Total Bilirubin: 1.3 mg/dL — ABNORMAL HIGH (ref 0.3–1.2)
Total Protein: 6.6 g/dL (ref 6.5–8.1)

## 2021-06-16 NOTE — Discharge Instructions (Addendum)
Lisa Bartlett, ? ?You were in the hospital because of a blockage in your biliary system (system connecting liver, gallbladder and pancreas). This was caused by a gallstone that got stuck. Thankfully, the stone came free on its own and you have improved without the need for a procedure. You will need to follow-up with the general surgeon; an appointment has been made for you. It was a pleasure taking care of you. Continue you enjoy your little girl. ? ?Sincerely, ? ?Jacquelin Hawking, MD ? ?

## 2021-06-16 NOTE — Progress Notes (Signed)
[facility] Progress Note ? ?Lisa Bartlett 20 y.o. Sep 30, 2000 ? ? ?Subjective: ?Feels good. Tolerating liquid diet. Denies abdominal pain. Loose stools overnight. ? ?Objective: ?Vital signs: ?Vitals:  ? 06/15/21 2153 06/16/21 0534  ?BP: 134/84 (!) 120/57  ?Pulse: 65 (!) 52  ?Resp: 17 18  ?Temp: 98.4 ?F (36.9 ?C) 98.5 ?F (36.9 ?C)  ?SpO2: 100% 99%  ? ? ?Physical Exam: ?Gen: alert, no acute distress, well-nourished, pleasant ?HEENT: anicteric sclera ?CV: RRR ?Chest: CTA B ?Abd: soft, nontender, nondistended, +BS ?Ext: no edema ? ?Lab Results: ?Recent Labs  ?  06/15/21 ?0458 06/16/21 ?0455  ?NA 136 136  ?K 3.5 3.9  ?CL 107 105  ?CO2 21* 24  ?GLUCOSE 87 83  ?BUN 8 8  ?CREATININE 0.65 0.64  ?CALCIUM 8.5* 8.8*  ? ?Recent Labs  ?  06/15/21 ?0458 06/16/21 ?0455  ?AST 113* 47*  ?ALT 178* 130*  ?ALKPHOS 136* 123  ?BILITOT 4.0* 1.3*  ?PROT 6.5 6.6  ?ALBUMIN 3.4* 3.6  ? ?Recent Labs  ?  06/15/21 ?0458  ?WBC 4.9  ?NEUTROABS 3.0  ?HGB 11.9*  ?HCT 36.2  ?MCV 81.9  ?PLT 293  ? ? ? ? ?Assessment/Plan: ?Likely passed a gallstone - LFTs improving. Stable. Tolerating liquids and just ordered solid tray. Ok to go home today and will need f/u with general surgery to discuss timing of lap chole. F/U with GI prn. ? ? ?Shirley Friar ?06/16/2021, 9:18 AM ? ?Questions please call [phone] Patient ID: Lisa Bartlett, female   DOB: 10-15-2000, 21 y.o.   MRN: [id number] ? ?

## 2021-06-16 NOTE — Progress Notes (Incomplete)
[facility] Progress Note ? ?Lisa Bartlett 20 y.o. [birth year] ? ?CC:  *** ? ? ?Subjective: ?*** ? ?ROS : *** ? ? ?Objective: ?Vital signs in last 24 hours: ?Vitals:  ? 06/15/21 2153 06/16/21 0534  ?BP: 134/84 (!) 120/57  ?Pulse: 65 (!) 52  ?Resp: 17 18  ?Temp: 98.4 ?F (36.9 ?C) 98.5 ?F (36.9 ?C)  ?SpO2: 100% 99%  ? ? ?Physical Exam: ? ?General:  Alert, cooperative, no distress, appears stated age  ?Head:  Normocephalic, without obvious abnormality, atraumatic  ?Eyes:  Anicteric sclera, EOM's intact  ?Lungs:   Clear to auscultation bilaterally, respirations unlabored  ?Heart:  Regular rate and rhythm, S1, S2 normal  ?Abdomen:   Soft, non-tender, bowel sounds active all four quadrants,  no masses,   ?Extremities: Extremities normal, atraumatic, no  edema  ?Pulses: 2+ and symmetric  ? ? ?Lab Results: ?Recent Labs  ?  06/15/21 ?0458 06/16/21 ?0455  ?NA 136 136  ?K 3.5 3.9  ?CL 107 105  ?CO2 21* 24  ?GLUCOSE 87 83  ?BUN 8 8  ?CREATININE 0.65 0.64  ?CALCIUM 8.5* 8.8*  ? ?Recent Labs  ?  06/15/21 ?0458 06/16/21 ?0455  ?AST 113* 47*  ?ALT 178* 130*  ?ALKPHOS 136* 123  ?BILITOT 4.0* 1.3*  ?PROT 6.5 6.6  ?ALBUMIN 3.4* 3.6  ? ?Recent Labs  ?  06/15/21 ?0458  ?WBC 4.9  ?NEUTROABS 3.0  ?HGB 11.9*  ?HCT 36.2  ?MCV 81.9  ?PLT 293  ? ?Recent Labs  ?  06/15/21 ?0458  ?LABPROT 14.5  ?INR 1.1  ? ? ? ? ?Assessment ?*** ? ?RUQ pain ?Jaundice ?Intrahepatic duct dilation  ?  ?HGB 11.9 Platelets 293 ?AST 47 (113) ALT 130 (178)  ?Alkphos [id number]) TBili (1.3)4.0 ?GFR >60  ?INR 06/15/2021 1.1  ?  ?Outside hospital CT AP significant for intrahepatic duct dilation.  ?Findings consistent with obstructive jaundice. Possible due to gallstones, sludge or possible malignancy.  ?  ? ?IMPRESSION: ?1. Mildly motion degraded exam. ?2. Cholelithiasis. The intra and extrahepatic biliary duct ?dilatation is improved to resolved, as detailed above. Question ?interval stone passage. ?3. No other explanation for jaundice. ? ?Plan: ? ? ?Emmit Alexanders PA-C ?06/16/2021, 8:16 AM ? ?Contact #  [phone]  ?

## 2021-06-16 NOTE — Discharge Summary (Signed)
?Physician Discharge Summary ?  ?Patient: Lisa Bartlett MRN: [id number] DOB: 12-21-2000  ?Admit date:     06/15/2021  ?Discharge date: 06/16/21  ?Discharge Physician: Jacquelin Hawking, MD  ? ?PCP: Pcp, No  ? ?Recommendations at discharge:  ? ?General surgery follow-up for consideration of cholecystectomy ? ?Discharge Diagnoses: ?Active Problems: ?  Cholelithiasis ? ?Resolved Problems: ?  * No resolved hospital problems. * ? ?Assessment and Plan: ?* Obstructive jaundice ?Patient with jaundice and intrahepatic ductal dilation. Elevated AST/ALT/Bilriubin and ALP on admission. GI consult. Hepatitis panel negative. MRCP ordered and is significant for no evidence of choledocholithiasis, suggesting likely passage of biliary stone. GI recommending outpatient follow-up with general surgery. General surgery have scheduled an appointment for follow-up. Repeat CMP with improved AST/ALT/Bilirubin. Patient states she is in the process of establishing care with a PCP in [place]. ? ? ? ?  ? ? ?Consultants: [facility] ?Procedures performed: None  ?Disposition: Home ?Diet recommendation:  ?Regular diet ? ?DISCHARGE MEDICATION: ?Allergies as of 06/16/2021   ?No Known Allergies ?  ? ?  ?Medication List  ?  ? ?TAKE these medications   ? ?acetaminophen 500 MG tablet ?Commonly known as: TYLENOL ?Take 500-1,000 mg by mouth daily as needed (pain/menstrual cramps). ?  ?Nexplanon 68 MG Impl implant ?Generic drug: etonogestrel ?1 each by Subdermal route once. Implanted April 28, 2021 ?  ? ?  ? ? Follow-up Information   ? ? Fritzi Mandes, MD. Go on 06/17/2021.   ?Specialty: General Surgery ?Why: Your appointment is 4/5 at 10:30am to discuss gallbladder surgery ?Please arrive 30 minutes prior to your appointment to check in and fill out paperwork. Bring photo ID and insurance information. ?Contact information: ?[address] ?[address] ?[place] [address] ?[phone] ? ? ?  ?  ? ? Charlott Rakes, MD Follow up.   ?Specialty:  Gastroenterology ?Why: As needed ?Contact information: ?[address] ?[address] ?[place] [address] ?[phone] ? ? ?  ?  ? ?  ?  ? ?  ? ?Discharge Exam: ?Filed Weights  ? 06/15/21 0200  ?Weight: 73.8 kg  ? ?General exam: Appears calm and comfortable ?Respiratory system: Clear to auscultation. Respiratory effort normal. ?Cardiovascular system: S1 & S2 heard, RRR. No murmurs, rubs, gallops or clicks. ?Gastrointestinal system: Abdomen is nondistended, soft and nontender. No organomegaly or masses felt. Normal bowel sounds heard. ?Central nervous system: Alert and oriented. No focal neurological deficits. ?Musculoskeletal: No edema. No calf tenderness ?Skin: No cyanosis. No rashes ?Psychiatry: Judgement and insight appear normal. Mood & affect appropriate.  ? ?Condition at discharge: stable ? ?The results of significant diagnostics from this hospitalization (including imaging, microbiology, ancillary and laboratory) are listed below for reference.  ? ?Imaging Studies: ?MR 3D Recon At Scanner ? ?Result Date: 06/15/2021 ?CLINICAL DATA:  Jaundiced EXAM: MRI ABDOMEN WITHOUT AND WITH CONTRAST (INCLUDING MRCP) TECHNIQUE: Multiplanar multisequence MR imaging of the abdomen was performed both before and after the administration of intravenous contrast. Heavily T2-weighted images of the biliary and pancreatic ducts were obtained, and three-dimensional MRCP images were rendered by post processing. CONTRAST:  62mL GADAVIST GADOBUTROL 1 MMOL/ML IV SOLN COMPARISON:  CT of yesterday from [facility] [place] FINDINGS: Portions of exam are mildly motion degraded. Lower chest: Normal heart size without pericardial or pleural effusion. Hepatobiliary: No evidence of cirrhosis or significant steatosis. There is a 2 mm cyst in the right hepatic lobe. The intrahepatic biliary duct dilatation is felt to be slightly improved, minimal. Multiple tiny gallstones including  on 18/4. No evidence of acute cholecystitis. The previously described  mild biliary duct dilatation is improved to resolved. For example, the common duct measures 4 mm in the porta hepatis on 11/07 versus 7 mm when measured in a similar fashion on the prior CT. No evidence of choledocholithiasis or obstructive mass. Pancreas:  Normal, without mass or ductal dilatation. Spleen:  Normal in size, without focal abnormality. Adrenals/Urinary Tract: Normal adrenal glands. Normal kidneys, without hydronephrosis. Stomach/Bowel: Normal stomach and abdominal bowel loops. Vascular/Lymphatic: Normal caliber of the aorta and branch vessels. No retroperitoneal or retrocrural adenopathy. Other:  No ascites. Musculoskeletal: No acute osseous abnormality. IMPRESSION: 1. Mildly motion degraded exam. 2. Cholelithiasis. The intra and extrahepatic biliary duct dilatation is improved to resolved, as detailed above. Question interval stone passage. 3. No other explanation for jaundice. Electronically Signed   By: Jeronimo Greaves M.D.   On: 06/15/2021 13:01  ? ?MR ABDOMEN MRCP W WO CONTAST ? ?Result Date: 06/15/2021 ?CLINICAL DATA:  Jaundiced EXAM: MRI ABDOMEN WITHOUT AND WITH CONTRAST (INCLUDING MRCP) TECHNIQUE: Multiplanar multisequence MR imaging of the abdomen was performed both before and after the administration of intravenous contrast. Heavily T2-weighted images of the biliary and pancreatic ducts were obtained, and three-dimensional MRCP images were rendered by post processing. CONTRAST:  7mL GADAVIST GADOBUTROL 1 MMOL/ML IV SOLN COMPARISON:  CT of yesterday from [facility] [place] FINDINGS: Portions of exam are mildly motion degraded. Lower chest: Normal heart size without pericardial or pleural effusion. Hepatobiliary: No evidence of cirrhosis or significant steatosis. There is a 2 mm cyst in the right hepatic lobe. The intrahepatic biliary duct dilatation is felt to be slightly improved, minimal. Multiple tiny gallstones including on 18/4. No evidence of acute cholecystitis. The previously described mild  biliary duct dilatation is improved to resolved. For example, the common duct measures 4 mm in the porta hepatis on 11/07 versus 7 mm when measured in a similar fashion on the prior CT. No evidence of choledocholithiasis or obstructive mass. Pancreas:  Normal, without mass or ductal dilatation. Spleen:  Normal in size, without focal abnormality. Adrenals/Urinary Tract: Normal adrenal glands. Normal kidneys, without hydronephrosis. Stomach/Bowel: Normal stomach and abdominal bowel loops. Vascular/Lymphatic: Normal caliber of the aorta and branch vessels. No retroperitoneal or retrocrural adenopathy. Other:  No ascites. Musculoskeletal: No acute osseous abnormality. IMPRESSION: 1. Mildly motion degraded exam. 2. Cholelithiasis. The intra and extrahepatic biliary duct dilatation is improved to resolved, as detailed above. Question interval stone passage. 3. No other explanation for jaundice. Electronically Signed   By: Jeronimo Greaves M.D.   On: 06/15/2021 13:01   ? ?Microbiology: ?No results found for this or any previous visit. ? ?Labs: ?CBC: ?Recent Labs  ?Lab 06/15/21 ?0458  ?WBC 4.9  ?NEUTROABS 3.0  ?HGB 11.9*  ?HCT 36.2  ?MCV 81.9  ?PLT 293  ? ?Basic Metabolic Panel: ?Recent Labs  ?Lab 06/15/21 ?0458 06/16/21 ?0455  ?NA 136 136  ?K 3.5 3.9  ?CL 107 105  ?CO2 21* 24  ?GLUCOSE 87 83  ?BUN 8 8  ?CREATININE 0.65 0.64  ?CALCIUM 8.5* 8.8*  ? ?Liver Function Tests: ?Recent Labs  ?Lab 06/15/21 ?0458 06/16/21 ?0455  ?AST 113* 47*  ?ALT 178* 130*  ?ALKPHOS 136* 123  ?BILITOT 4.0* 1.3*  ?PROT 6.5 6.6  ?ALBUMIN 3.4* 3.6  ? ? ?Signed: ?Jacquelin Hawking, MD ?[facility] ?06/16/2021 ?

## 2021-06-17 ENCOUNTER — Ambulatory Visit: Payer: Self-pay | Admitting: Surgery

## 2021-06-17 ENCOUNTER — Encounter (HOSPITAL_COMMUNITY): Payer: Self-pay | Admitting: Surgery

## 2021-06-17 ENCOUNTER — Other Ambulatory Visit: Payer: Self-pay

## 2021-06-17 NOTE — H&P (View-Only) (Signed)
History of Present Illness: ?Lisa Bartlett is a 21 y.o. female who was referred to me for evaluation of gallstones. About 5 days ago, she woke up at night with severe upper abdominal pain. She went to urgent care and was sent to the ED in [place]. She was noted to be jaundiced, and labs confirmed a bilirubin of 5.4 with elevated transaminases and alk phos of 190. A CT scan showed dilation of the common bile duct. She was transferred to [facility] and admitted on 4/3. After admission her abdominal pain resolved. Yesterday morning her Tbili had decreased to 1.3, and alk phos was 123. Serum lipase was 88 in [place], and 52 on admission to [facility]. An MRCP was done on 4/3 and showed improvement in the biliary ductal dilation with no evidence of choledocholithiasis, cholecystitis, or pancreatitis, although the patient did have multiple small gallstones. GI was consulted and recommended outpatient follow up. The patient was discharged yesterday as her symptoms and LFTs had improved. ?  ?She is here today to discuss cholecystectomy. She says that overnight she had another episode of pain noticed her urine looks bloody "again." She says it is darker in color like it was when she had her first episode of pain. She had vomiting overnight with the pain. The pain is better now, but she still has epigastric discomfort and minimal appetite. ?  ?Her only prior abdominal surgery is a C section 5 months ago. She is supposed to start a new job this coming Monday, which she says she will do remotely from home. ?  ?  ?  ?Review of Systems: ?A complete review of systems was obtained from the patient.  I have reviewed this information and discussed as appropriate with the patient.  See HPI as well for other ROS. ?  ?  ?Medical History: ?Past Medical History ?History reviewed. No pertinent past medical history. ? ?  ?There is no problem list on file for this patient. ?  ?  ?Past Surgical History ?Past Surgical  History: ?Procedure Laterality Date ? c sections   01/03/2021 ? ?  ?  ?Allergies ?No Known Allergies ? ?  ?Current Outpatient Medications on File Prior to Visit ?Medication Sig Dispense Refill ? etonorgestrel (NEXPLANON) 68 mg implant Inject subcutaneously     ?  ?No current facility-administered medications on file prior to visit. ?  ?  ?Family History ?History reviewed. No pertinent family history. ?  ?  ?Social History ?  ?Tobacco Use ?Smoking Status Never ?Smokeless Tobacco Never ?  ?  ?Social History ?Social History ?  ? ?Socioeconomic History ? Marital status: Single ?Tobacco Use ? Smoking status: Never ? Smokeless tobacco: Never ?Substance and Sexual Activity ? Alcohol use: Never ? Drug use: Never ? ?  ?  ?Objective: ?  ?  ?Vitals: ?  06/17/21 1043 ?BP: 118/76 ?Pulse: 88 ?Temp: 36.7 ?C (98 ?F) ?SpO2: 98% ?Weight: 69.5 kg (153 lb 3.2 oz) ?Height: 154.9 cm (_0 ) ?  ?Body mass index is 28.95 kg/m?. ?  ?Physical Exam ?Vitals reviewed.  ?Constitutional:   ?   General: She is not in acute distress. ?   Appearance: Normal appearance.  ?HENT:  ?   Head: Normocephalic and atraumatic.  ?Eyes:  ?   General: No scleral icterus. ?   Conjunctiva/sclera: Conjunctivae normal.  ?Cardiovascular:  ?   Rate and Rhythm: Normal rate and regular rhythm.  ?   Heart sounds: No murmur heard. ?Pulmonary:  ?   Effort: Pulmonary effort  is normal. No respiratory distress.  ?   Breath sounds: Normal breath sounds. No wheezing.  ?Abdominal:  ?   General: There is no distension.  ?   Palpations: Abdomen is soft.  ?   Comments: Mild epigastric tenderness to palpation.  ?Musculoskeletal:     ?   General: No swelling. Normal range of motion.  ?   Cervical back: Normal range of motion.  ?Skin: ?   General: Skin is warm and dry.  ?   Coloration: Skin is not jaundiced.  ?Neurological:  ?   General: No focal deficit present.  ?   Mental Status: She is alert and oriented to person, place, and time.  ?Psychiatric:     ?   Mood and Affect: Mood  normal.     ?   Behavior: Behavior normal.     ?   Thought Content: Thought content normal.  ?  ?  ?  ?  ?  ?Labs, Imaging and Diagnostic Testing: ?IMPRESSION: ?1. Mildly motion degraded exam. ?2. Cholelithiasis. The intra and extrahepatic biliary duct ?dilatation is improved to resolved, as detailed above. Question ?interval stone passage. ?3. No other explanation for jaundice. ?  ?Assessment and Plan: ?Diagnoses and all orders for this visit: ?  ?Choledocholithiasis ?  ?Calculus of gallbladder with biliary obstruction but without cholecystitis ?  ?Epigastric pain ?  ?  ?  ?This is a 21 yo female previously healthy who has been having epigastric pain with an associated transient elevation in LFTs.  I have personally reviewed her inpatient notes, her labs, her CT scan and her MRI.  Initial CT showed mild extrahepatic biliary ductal dilation, which resolved on subsequent MRCP.  There were no signs of acute pancreatitis or cholecystitis.  She has continued to have pain after discharge from the hospital.  Her lab and imaging findings are presumably secondary to a stone that spontaneously passed via the common bile duct.  I discussed that she is at risk for repeated episodes of choledocholithiasis with associated complications including acute pancreatitis and cholangitis.  She is not clinically jaundiced today, although does endorse darkening of her urine.  I think that the blood she is describing is actually darkening of the urine secondary to hyperbilirubinemia.  Prior UA did not show any blood.  Given her ongoing pain, we will schedule surgery as soon as possible. Laparoscopic cholecystectomy was recommended. The details of this procedure were discussed with the patient, including the risks of bleeding, infection, bile leak, and <0.5% risk of common bile duct injury. The patient expressed understanding and agrees to proceed with surgery. I will also perform an intraoperative cholangiogram to ensure she does not have  retained stones in the common bile duct. She has been scheduled for surgery tomorrow afternoon so that she can hopefully have resolution in symptoms and begin her new job as planned next week. I reviewed the postoperative activity restrictions. ? ?Michaelle Birks, MD ?[facility] Surgery ?General, Hepatobiliary and Pancreatic Surgery ?06/17/21 11:45 AM ? ? ?

## 2021-06-17 NOTE — H&P (Signed)
History of Present Illness: ?Lisa Bartlett is a 21 y.o. female who was referred to me for evaluation of gallstones. About 5 days ago, she woke up at night with severe upper abdominal pain. She went to urgent care and was sent to the ED in [place]. She was noted to be jaundiced, and labs confirmed a bilirubin of 5.4 with elevated transaminases and alk phos of 190. A CT scan showed dilation of the common bile duct. She was transferred to [facility] and admitted on 4/3. After admission her abdominal pain resolved. Yesterday morning her Tbili had decreased to 1.3, and alk phos was 123. Serum lipase was 88 in [place], and 52 on admission to [facility]. An MRCP was done on 4/3 and showed improvement in the biliary ductal dilation with no evidence of choledocholithiasis, cholecystitis, or pancreatitis, although the patient did have multiple small gallstones. GI was consulted and recommended outpatient follow up. The patient was discharged yesterday as her symptoms and LFTs had improved. ?  ?She is here today to discuss cholecystectomy. She says that overnight she had another episode of pain noticed her urine looks bloody "again." She says it is darker in color like it was when she had her first episode of pain. She had vomiting overnight with the pain. The pain is better now, but she still has epigastric discomfort and minimal appetite. ?  ?Her only prior abdominal surgery is a C section 5 months ago. She is supposed to start a new job this coming Monday, which she says she will do remotely from home. ?  ?  ?  ?Review of Systems: ?A complete review of systems was obtained from the patient.  I have reviewed this information and discussed as appropriate with the patient.  See HPI as well for other ROS. ?  ?  ?Medical History: ?Past Medical History ?History reviewed. No pertinent past medical history. ? ?  ?There is no problem list on file for this patient. ?  ?  ?Past Surgical History ?Past Surgical  History: ?Procedure Laterality Date ? c sections   01/03/2021 ? ?  ?  ?Allergies ?No Known Allergies ? ?  ?Current Outpatient Medications on File Prior to Visit ?Medication Sig Dispense Refill ? etonorgestrel (NEXPLANON) 68 mg implant Inject subcutaneously     ?  ?No current facility-administered medications on file prior to visit. ?  ?  ?Family History ?History reviewed. No pertinent family history. ?  ?  ?Social History ?  ?Tobacco Use ?Smoking Status Never ?Smokeless Tobacco Never ?  ?  ?Social History ?Social History ?  ? ?Socioeconomic History ? Marital status: Single ?Tobacco Use ? Smoking status: Never ? Smokeless tobacco: Never ?Substance and Sexual Activity ? Alcohol use: Never ? Drug use: Never ? ?  ?  ?Objective: ?  ?  ?Vitals: ?  06/17/21 1043 ?BP: 118/76 ?Pulse: 88 ?Temp: 36.7 ?C (98 ?F) ?SpO2: 98% ?Weight: 69.5 kg (153 lb 3.2 oz) ?Height: 154.9 cm (_0 ) ?  ?Body mass index is 28.95 kg/m?. ?  ?Physical Exam ?Vitals reviewed.  ?Constitutional:   ?   General: She is not in acute distress. ?   Appearance: Normal appearance.  ?HENT:  ?   Head: Normocephalic and atraumatic.  ?Eyes:  ?   General: No scleral icterus. ?   Conjunctiva/sclera: Conjunctivae normal.  ?Cardiovascular:  ?   Rate and Rhythm: Normal rate and regular rhythm.  ?   Heart sounds: No murmur heard. ?Pulmonary:  ?   Effort: Pulmonary effort  is normal. No respiratory distress.  ?   Breath sounds: Normal breath sounds. No wheezing.  ?Abdominal:  ?   General: There is no distension.  ?   Palpations: Abdomen is soft.  ?   Comments: Mild epigastric tenderness to palpation.  ?Musculoskeletal:     ?   General: No swelling. Normal range of motion.  ?   Cervical back: Normal range of motion.  ?Skin: ?   General: Skin is warm and dry.  ?   Coloration: Skin is not jaundiced.  ?Neurological:  ?   General: No focal deficit present.  ?   Mental Status: She is alert and oriented to person, place, and time.  ?Psychiatric:     ?   Mood and Affect: Mood  normal.     ?   Behavior: Behavior normal.     ?   Thought Content: Thought content normal.  ?  ?  ?  ?  ?  ?Labs, Imaging and Diagnostic Testing: ?IMPRESSION: ?1. Mildly motion degraded exam. ?2. Cholelithiasis. The intra and extrahepatic biliary duct ?dilatation is improved to resolved, as detailed above. Question ?interval stone passage. ?3. No other explanation for jaundice. ?  ?Assessment and Plan: ?Diagnoses and all orders for this visit: ?  ?Choledocholithiasis ?  ?Calculus of gallbladder with biliary obstruction but without cholecystitis ?  ?Epigastric pain ?  ?  ?  ?This is a 21 yo female previously healthy who has been having epigastric pain with an associated transient elevation in LFTs.  I have personally reviewed her inpatient notes, her labs, her CT scan and her MRI.  Initial CT showed mild extrahepatic biliary ductal dilation, which resolved on subsequent MRCP.  There were no signs of acute pancreatitis or cholecystitis.  She has continued to have pain after discharge from the hospital.  Her lab and imaging findings are presumably secondary to a stone that spontaneously passed via the common bile duct.  I discussed that she is at risk for repeated episodes of choledocholithiasis with associated complications including acute pancreatitis and cholangitis.  She is not clinically jaundiced today, although does endorse darkening of her urine.  I think that the blood she is describing is actually darkening of the urine secondary to hyperbilirubinemia.  Prior UA did not show any blood.  Given her ongoing pain, we will schedule surgery as soon as possible. Laparoscopic cholecystectomy was recommended. The details of this procedure were discussed with the patient, including the risks of bleeding, infection, bile leak, and <0.5% risk of common bile duct injury. The patient expressed understanding and agrees to proceed with surgery. I will also perform an intraoperative cholangiogram to ensure she does not have  retained stones in the common bile duct. She has been scheduled for surgery tomorrow afternoon so that she can hopefully have resolution in symptoms and begin her new job as planned next week. I reviewed the postoperative activity restrictions. ? ?Michaelle Birks, MD ?[facility] Surgery ?General, Hepatobiliary and Pancreatic Surgery ?06/17/21 11:45 AM ? ? ?

## 2021-06-17 NOTE — Progress Notes (Signed)
PCP - Denies ? ?Cardiologist - Denies ? ?EP- Denies ? ?Endocrine- Denies ? ?Pulm- Denies ? ?Chest x-ray - Denies ? ?EKG - Denies ? ?Stress Test - Denies ? ?ECHO - Denies ? ?Cardiac Cath - Denies ? ?AICD-na ?PM-na ?LOOP-na ? ?Nerve Stimulator- Denies ? ?Dialysis- Denies ? ?Sleep Study - Denies ?CPAP - Denies ? ?LABS-06/15/21: CBC w/D, CMP ? ?ASA- Denies ? ?ERAS-Yes- clears until 1130 ? ?HA1C- Denies ? ?Anesthesia- No ? ?Pt denies having chest pain, sob, or fever during the pre-op phone call. All instructions explained to the pt, with a verbal understanding of the material including: as of today, stop taking all Aspirin (unless instructed by your doctor) and Other Aspirin containing products, Vitamins, Fish oils, and Herbal medications. Also stop all NSAIDS i.e. Advil, Ibuprofen, Motrin, Aleve, Anaprox, Naproxen, BC, Goody Powders, and all Supplements. Pt also instructed to wear a mask and social distance if she goes out. The opportunity to ask questions was provided.  ?

## 2021-06-18 ENCOUNTER — Ambulatory Visit (HOSPITAL_COMMUNITY): Payer: Medicaid Other | Admitting: Anesthesiology

## 2021-06-18 ENCOUNTER — Encounter (HOSPITAL_COMMUNITY): Payer: Self-pay | Admitting: Surgery

## 2021-06-18 ENCOUNTER — Encounter (HOSPITAL_COMMUNITY)
Admission: AD | Disposition: A | Discharge status: Home / Self Care | Payer: Self-pay | Source: Home / Self Care | Attending: Surgery

## 2021-06-18 ENCOUNTER — Inpatient Hospital Stay (HOSPITAL_COMMUNITY)
Admission: AD | Admit: 2021-06-18 | Discharge: 2021-06-21 | DRG: 419 | Disposition: A | Discharge status: Home / Self Care | Payer: Medicaid Other | Attending: Surgery | Admitting: Surgery

## 2021-06-18 ENCOUNTER — Ambulatory Visit (HOSPITAL_COMMUNITY): Payer: Medicaid Other

## 2021-06-18 ENCOUNTER — Other Ambulatory Visit: Payer: Self-pay

## 2021-06-18 DIAGNOSIS — K8050 Calculus of bile duct without cholangitis or cholecystitis without obstruction: Principal | ICD-10-CM

## 2021-06-18 DIAGNOSIS — K8061 Calculus of gallbladder and bile duct with cholecystitis, unspecified, with obstruction: Principal | ICD-10-CM | POA: Diagnosis present

## 2021-06-18 DIAGNOSIS — K8070 Calculus of gallbladder and bile duct without cholecystitis without obstruction: Secondary | ICD-10-CM

## 2021-06-18 HISTORY — PX: CHOLECYSTECTOMY: SHX55

## 2021-06-18 LAB — COMPREHENSIVE METABOLIC PANEL
ALT: 145 U/L — ABNORMAL HIGH (ref 0–44)
AST: 56 U/L — ABNORMAL HIGH (ref 15–41)
Albumin: 3.5 g/dL (ref 3.5–5.0)
Alkaline Phosphatase: 133 U/L — ABNORMAL HIGH (ref 38–126)
Anion gap: 8 (ref 5–15)
BUN: 8 mg/dL (ref 6–20)
CO2: 20 mmol/L — ABNORMAL LOW (ref 22–32)
Calcium: 8.8 mg/dL — ABNORMAL LOW (ref 8.9–10.3)
Chloride: 106 mmol/L (ref 98–111)
Creatinine, Ser: 0.69 mg/dL (ref 0.44–1.00)
GFR, Estimated: >60 mL/min (ref >60)
Glucose, Bld: 97 mg/dL (ref 70–99)
Potassium: 3.9 mmol/L (ref 3.5–5.1)
Sodium: 134 mmol/L — ABNORMAL LOW (ref 135–145)
Total Bilirubin: 1.1 mg/dL (ref 0.3–1.2)
Total Protein: 6.4 g/dL — ABNORMAL LOW (ref 6.5–8.1)

## 2021-06-18 LAB — POCT PREGNANCY, URINE: Preg Test, Ur: NEGATIVE

## 2021-06-18 LAB — CBC
HCT: 38.2 % (ref 36.0–46.0)
Hemoglobin: 13.1 g/dL (ref 12.0–15.0)
MCH: 27.8 pg (ref 26.0–34.0)
MCHC: 34.3 g/dL (ref 30.0–36.0)
MCV: 81.1 fL (ref 80.0–100.0)
Platelets: 294 10*3/uL (ref 150–400)
RBC: 4.71 MIL/uL (ref 3.87–5.11)
RDW: 13.6 % (ref 11.5–15.5)
WBC: 13.5 10*3/uL — ABNORMAL HIGH (ref 4.0–10.5)
nRBC: 0 % (ref 0.0–0.2)

## 2021-06-18 IMAGING — RF DG CHOLANGIOGRAM OPERATIVE
1 series · 8 of 8 positions shown · non-contrast
Comparison: MRI on [DATE]

CLINICAL DATA: Cholecystectomy for symptomatic cholelithiasis.

EXAM:
INTRAOPERATIVE CHOLANGIOGRAM
TECHNIQUE: Cholangiographic images from the C-arm fluoroscopic device were
submitted for interpretation post-operatively. Please see the
procedural report for the amount of contrast and the fluoroscopy
time utilized.
FLUOROSCOPY:
Radiation Exposure Index (as provided by the fluoroscopic device):
6.4 mGy Kerma

[Series 1: run · 2 acquisitions, 8 frames shown]
[im 1/2]
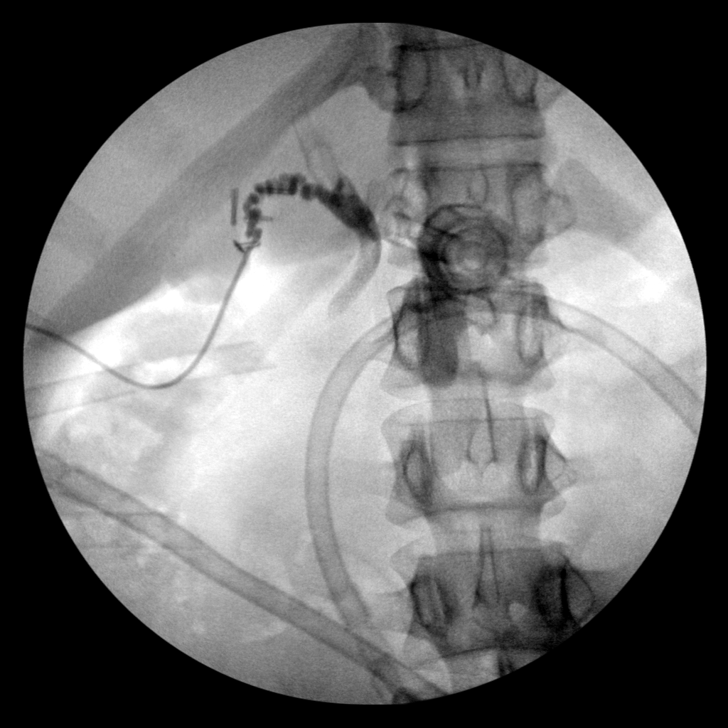
[im 1/2]
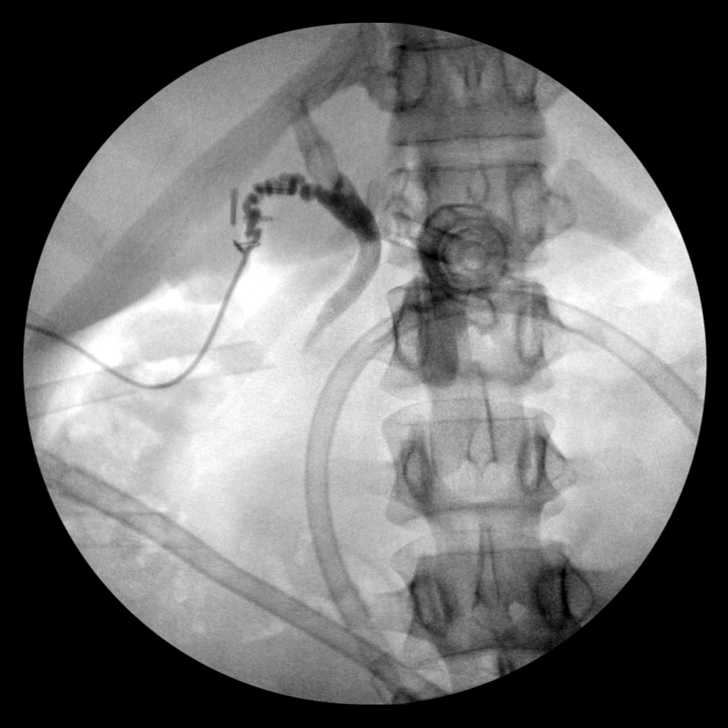
[im 1/2]
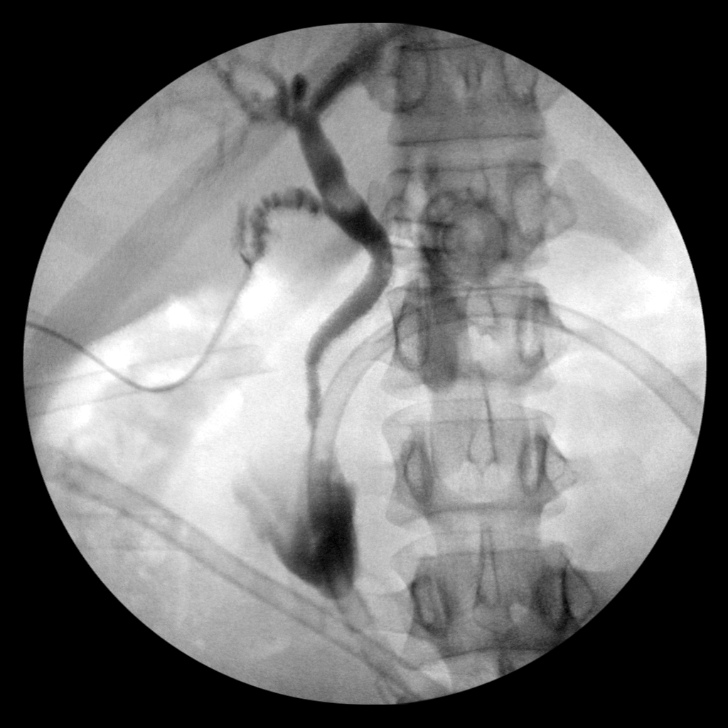
[im 1/2]
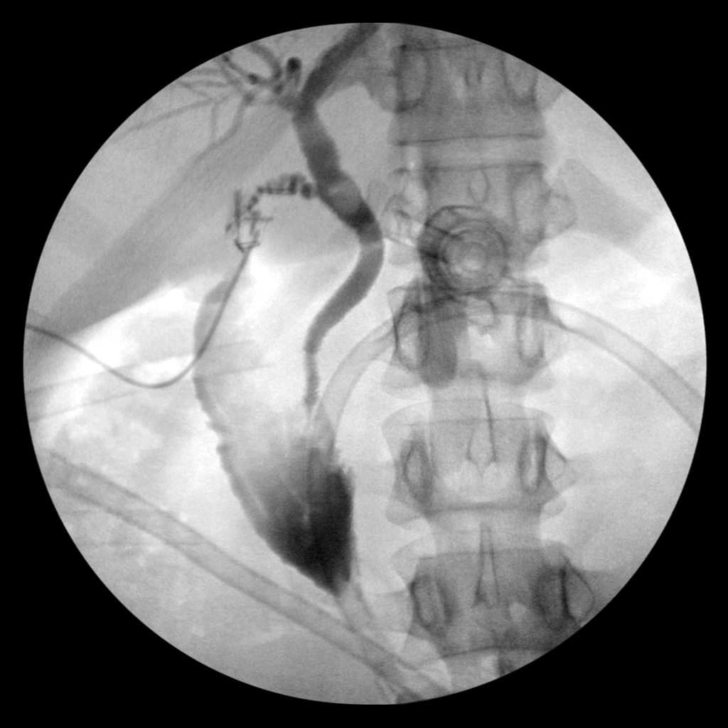
[im 2/2]
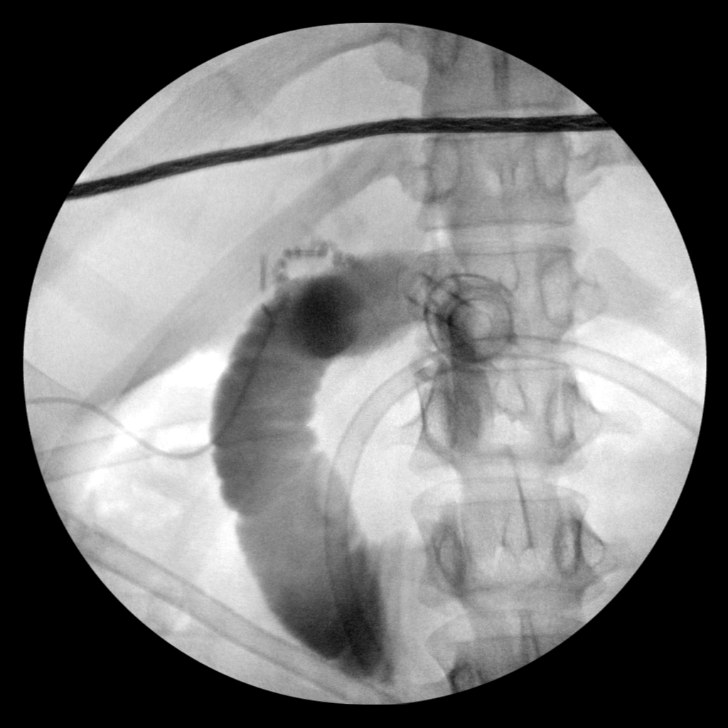
[im 2/2]
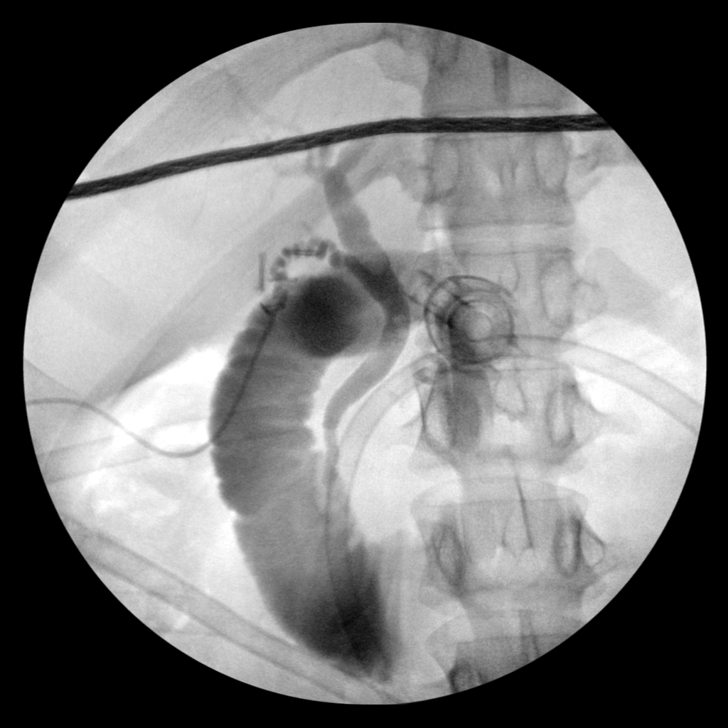
[im 2/2]
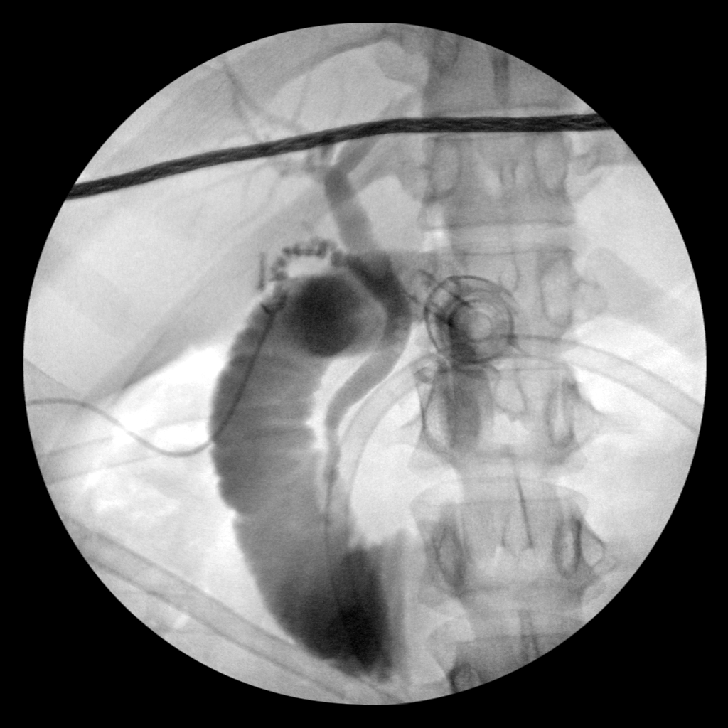
[im 2/2]
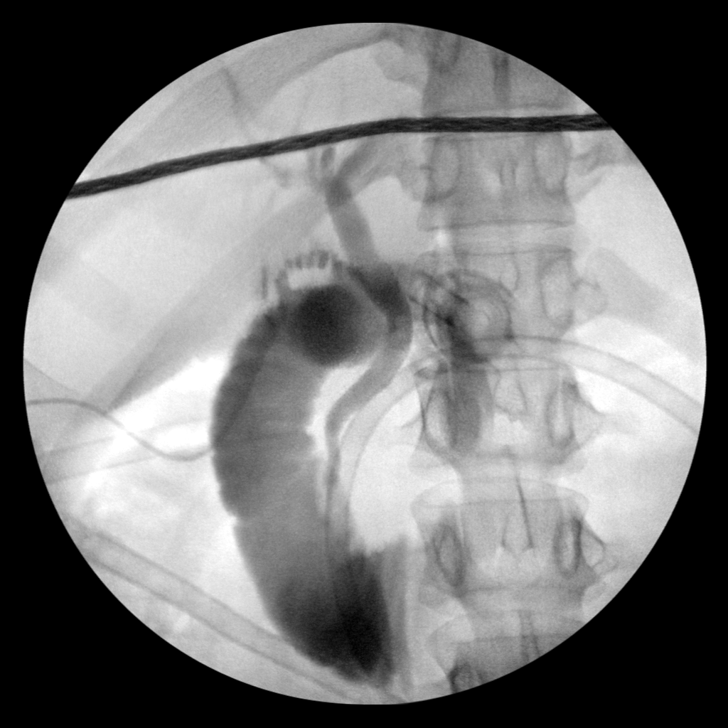

[8 of 8 positions shown; findings below may reference images not displayed]

FINDINGS: Intraoperative imaging with a C-arm demonstrates smooth ovoid
filling defects within the common bile duct with a larger persistent
filling defects seen on 2 contrast injections and some smaller
filling defects inferior to the larger defect. The common bile duct
itself is not significantly dilated with some potential mild
dilatation of the proper hepatic duct. Contrast enters the duodenum
normally.
IMPRESSION: Evidence of choledocholithiasis with persistent filling defects
identified in the common bile duct.

## 2021-06-18 SURGERY — LAPAROSCOPIC CHOLECYSTECTOMY WITH INTRAOPERATIVE CHOLANGIOGRAM
Anesthesia: General

## 2021-06-18 MED ORDER — ACETAMINOPHEN 500 MG PO TABS
ORAL_TABLET | ORAL | Status: AC
  Administered 2021-06-18: 1000 mg via ORAL
  Filled 2021-06-18: qty 2

## 2021-06-18 MED ORDER — SUCCINYLCHOLINE 20MG/ML (10ML) SYRINGE FOR MEDFUSION PUMP - OPTIME
INTRAMUSCULAR | Status: DC | PRN
  Administered 2021-06-18: 120 mg via INTRAVENOUS

## 2021-06-18 MED ORDER — ACETAMINOPHEN 500 MG PO TABS: 1000.0000 mg | ORAL_TABLET | ORAL | Status: AC

## 2021-06-18 MED ORDER — HYDROMORPHONE HCL 1 MG/ML IJ SOLN
INTRAMUSCULAR | Status: AC
  Filled 2021-06-18: qty 1

## 2021-06-18 MED ORDER — SODIUM CHLORIDE 0.9 % IV SOLN
INTRAVENOUS | Status: DC | PRN
  Administered 2021-06-18: 40 mL

## 2021-06-18 MED ORDER — DIPHENHYDRAMINE HCL 25 MG PO CAPS: 25.0000 mg | ORAL_CAPSULE | Freq: Four times a day (QID) | ORAL | Status: DC | PRN

## 2021-06-18 MED ORDER — DOCUSATE SODIUM 100 MG PO CAPS
100.0000 mg | ORAL_CAPSULE | Freq: Two times a day (BID) | ORAL | Status: DC
  Administered 2021-06-18 – 2021-06-20 (×4): 100 mg via ORAL
  Filled 2021-06-18 (×6): qty 1

## 2021-06-18 MED ORDER — DEXMEDETOMIDINE (PRECEDEX) IN NS 20 MCG/5ML (4 MCG/ML) IV SYRINGE
PREFILLED_SYRINGE | INTRAVENOUS | Status: AC
  Filled 2021-06-18: qty 5

## 2021-06-18 MED ORDER — PROPOFOL 10 MG/ML IV BOLUS
INTRAVENOUS | Status: AC
  Filled 2021-06-18: qty 20

## 2021-06-18 MED ORDER — MIDAZOLAM HCL 2 MG/2ML IJ SOLN
INTRAMUSCULAR | Status: DC | PRN
  Administered 2021-06-18: 2 mg via INTRAVENOUS

## 2021-06-18 MED ORDER — LACTATED RINGERS IV SOLN: INTRAVENOUS | Status: DC

## 2021-06-18 MED ORDER — ROCURONIUM BROMIDE 10 MG/ML (PF) SYRINGE
PREFILLED_SYRINGE | INTRAVENOUS | Status: DC | PRN
  Administered 2021-06-18: 20 mg via INTRAVENOUS
  Administered 2021-06-18: 40 mg via INTRAVENOUS

## 2021-06-18 MED ORDER — DIPHENHYDRAMINE HCL 50 MG/ML IJ SOLN: 25.0000 mg | Freq: Four times a day (QID) | INTRAMUSCULAR | Status: DC | PRN

## 2021-06-18 MED ORDER — OXYCODONE HCL 5 MG/5ML PO SOLN: 5.0000 mg | Freq: Once | ORAL | Status: DC | PRN

## 2021-06-18 MED ORDER — MEPERIDINE HCL 25 MG/ML IJ SOLN: 6.2500 mg | INTRAMUSCULAR | Status: DC | PRN

## 2021-06-18 MED ORDER — PROPOFOL 10 MG/ML IV BOLUS
INTRAVENOUS | Status: DC | PRN
  Administered 2021-06-18: 200 mg via INTRAVENOUS

## 2021-06-18 MED ORDER — CEFAZOLIN SODIUM-DEXTROSE 2-4 GM/100ML-% IV SOLN
2.0000 g | INTRAVENOUS | Status: AC
  Administered 2021-06-18: 2 g via INTRAVENOUS

## 2021-06-18 MED ORDER — CHLORHEXIDINE GLUCONATE 0.12 % MT SOLN
OROMUCOSAL | Status: AC
  Administered 2021-06-18: 15 mL via OROMUCOSAL
  Filled 2021-06-18: qty 15

## 2021-06-18 MED ORDER — ONDANSETRON HCL 4 MG/2ML IJ SOLN
INTRAMUSCULAR | Status: AC
  Filled 2021-06-18: qty 2

## 2021-06-18 MED ORDER — FENTANYL CITRATE (PF) 250 MCG/5ML IJ SOLN
INTRAMUSCULAR | Status: AC
  Filled 2021-06-18: qty 5

## 2021-06-18 MED ORDER — ONDANSETRON HCL 4 MG/2ML IJ SOLN
INTRAMUSCULAR | Status: DC | PRN
  Administered 2021-06-18 (×2): 4 mg via INTRAVENOUS

## 2021-06-18 MED ORDER — GABAPENTIN 300 MG PO CAPS: 300.0000 mg | ORAL_CAPSULE | ORAL | Status: AC

## 2021-06-18 MED ORDER — CHLORHEXIDINE GLUCONATE 0.12 % MT SOLN: 15.0000 mL | Freq: Once | OROMUCOSAL | Status: AC

## 2021-06-18 MED ORDER — GABAPENTIN 300 MG PO CAPS
ORAL_CAPSULE | ORAL | Status: AC
  Administered 2021-06-18: 300 mg via ORAL
  Filled 2021-06-18: qty 1

## 2021-06-18 MED ORDER — MIDAZOLAM HCL 2 MG/2ML IJ SOLN: 0.5000 mg | Freq: Once | INTRAMUSCULAR | Status: DC | PRN

## 2021-06-18 MED ORDER — DEXAMETHASONE SODIUM PHOSPHATE 10 MG/ML IJ SOLN
INTRAMUSCULAR | Status: DC | PRN
  Administered 2021-06-18: 5 mg via INTRAVENOUS

## 2021-06-18 MED ORDER — CEFAZOLIN SODIUM-DEXTROSE 2-4 GM/100ML-% IV SOLN
INTRAVENOUS | Status: AC
  Filled 2021-06-18: qty 100

## 2021-06-18 MED ORDER — HYDROMORPHONE HCL 1 MG/ML IJ SOLN
0.5000 mg | INTRAMUSCULAR | Status: DC | PRN
  Administered 2021-06-18: 0.5 mg via INTRAVENOUS
  Filled 2021-06-18: qty 0.5

## 2021-06-18 MED ORDER — MIDAZOLAM HCL 2 MG/2ML IJ SOLN
INTRAMUSCULAR | Status: AC
  Filled 2021-06-18: qty 2

## 2021-06-18 MED ORDER — OXYCODONE HCL 5 MG PO TABS: 5.0000 mg | ORAL_TABLET | Freq: Once | ORAL | Status: DC | PRN

## 2021-06-18 MED ORDER — GLUCAGON HCL RDNA (DIAGNOSTIC) 1 MG IJ SOLR
INTRAMUSCULAR | Status: AC
  Filled 2021-06-18: qty 1

## 2021-06-18 MED ORDER — ACETAMINOPHEN 500 MG PO TABS
1000.0000 mg | ORAL_TABLET | Freq: Four times a day (QID) | ORAL | Status: DC
  Administered 2021-06-18 – 2021-06-21 (×8): 1000 mg via ORAL
  Filled 2021-06-18 (×9): qty 2

## 2021-06-18 MED ORDER — BUPIVACAINE-EPINEPHRINE 0.25% -1:200000 IJ SOLN
INTRAMUSCULAR | Status: DC | PRN
  Administered 2021-06-18: 20 mL

## 2021-06-18 MED ORDER — SCOPOLAMINE 1 MG/3DAYS TD PT72
MEDICATED_PATCH | TRANSDERMAL | Status: AC
  Filled 2021-06-18: qty 1

## 2021-06-18 MED ORDER — OXYCODONE HCL 5 MG PO TABS
5.0000 mg | ORAL_TABLET | ORAL | Status: DC | PRN
  Administered 2021-06-18 – 2021-06-20 (×3): 5 mg via ORAL
  Filled 2021-06-18 (×3): qty 1

## 2021-06-18 MED ORDER — 0.9 % SODIUM CHLORIDE (POUR BTL) OPTIME
TOPICAL | Status: DC | PRN
  Administered 2021-06-18: 1000 mL

## 2021-06-18 MED ORDER — GLUCAGON HCL RDNA (DIAGNOSTIC) 1 MG IJ SOLR
INTRAMUSCULAR | Status: DC | PRN
  Administered 2021-06-18: .5 mg via INTRAVENOUS

## 2021-06-18 MED ORDER — ONDANSETRON 4 MG PO TBDP: 4.0000 mg | ORAL_TABLET | Freq: Four times a day (QID) | ORAL | Status: DC | PRN

## 2021-06-18 MED ORDER — DEXAMETHASONE SODIUM PHOSPHATE 10 MG/ML IJ SOLN
INTRAMUSCULAR | Status: AC
  Filled 2021-06-18: qty 1

## 2021-06-18 MED ORDER — FENTANYL CITRATE (PF) 250 MCG/5ML IJ SOLN
INTRAMUSCULAR | Status: DC | PRN
  Administered 2021-06-18: 50 ug via INTRAVENOUS
  Administered 2021-06-18: 100 ug via INTRAVENOUS
  Administered 2021-06-18: 50 ug via INTRAVENOUS

## 2021-06-18 MED ORDER — METHOCARBAMOL 500 MG PO TABS
500.0000 mg | ORAL_TABLET | Freq: Four times a day (QID) | ORAL | Status: DC | PRN
  Administered 2021-06-19: 500 mg via ORAL
  Filled 2021-06-18: qty 1

## 2021-06-18 MED ORDER — DEXMEDETOMIDINE (PRECEDEX) IN NS 20 MCG/5ML (4 MCG/ML) IV SYRINGE
PREFILLED_SYRINGE | INTRAVENOUS | Status: DC | PRN
  Administered 2021-06-18: 8 ug via INTRAVENOUS

## 2021-06-18 MED ORDER — BUPIVACAINE-EPINEPHRINE (PF) 0.25% -1:200000 IJ SOLN
INTRAMUSCULAR | Status: AC
  Filled 2021-06-18: qty 30

## 2021-06-18 MED ORDER — SCOPOLAMINE 1 MG/3DAYS TD PT72
1.0000 | MEDICATED_PATCH | TRANSDERMAL | Status: DC
  Administered 2021-06-18: 1.5 mg via TRANSDERMAL

## 2021-06-18 MED ORDER — SUGAMMADEX SODIUM 200 MG/2ML IV SOLN
INTRAVENOUS | Status: DC | PRN
  Administered 2021-06-18: 200 mg via INTRAVENOUS

## 2021-06-18 MED ORDER — PROMETHAZINE HCL 25 MG/ML IJ SOLN: 6.2500 mg | INTRAMUSCULAR | Status: DC | PRN

## 2021-06-18 MED ORDER — ORAL CARE MOUTH RINSE: 15.0000 mL | Freq: Once | OROMUCOSAL | Status: AC

## 2021-06-18 MED ORDER — HYDROMORPHONE HCL 1 MG/ML IJ SOLN
0.2500 mg | INTRAMUSCULAR | Status: DC | PRN
  Administered 2021-06-18 (×2): 0.5 mg via INTRAVENOUS

## 2021-06-18 MED ORDER — LIDOCAINE 2% (20 MG/ML) 5 ML SYRINGE
INTRAMUSCULAR | Status: DC | PRN
  Administered 2021-06-18: 40 mg via INTRAVENOUS

## 2021-06-18 MED ORDER — ONDANSETRON HCL 4 MG/2ML IJ SOLN: 4.0000 mg | Freq: Four times a day (QID) | INTRAMUSCULAR | Status: DC | PRN

## 2021-06-18 MED ORDER — SODIUM CHLORIDE 0.9 % IR SOLN
Status: DC | PRN
  Administered 2021-06-18: 1000 mL

## 2021-06-18 SURGICAL SUPPLY — 46 items
APPLIER CLIP 5 13 M/L LIGAMAX5 (MISCELLANEOUS) ×2
BAG COUNTER SPONGE SURGICOUNT (BAG) ×2 IMPLANT
BLADE CLIPPER SURG (BLADE) IMPLANT
CANISTER SUCT 3000ML PPV (MISCELLANEOUS) ×2 IMPLANT
CHLORAPREP W/TINT 26 (MISCELLANEOUS) ×2 IMPLANT
CLIP APPLIE 5 13 M/L LIGAMAX5 (MISCELLANEOUS) ×1 IMPLANT
COVER MAYO STAND STRL (DRAPES) ×3 IMPLANT
COVER SURGICAL LIGHT HANDLE (MISCELLANEOUS) ×2 IMPLANT
DERMABOND ADHESIVE PROPEN (GAUZE/BANDAGES/DRESSINGS) ×1
DERMABOND ADVANCED (GAUZE/BANDAGES/DRESSINGS) ×1
DERMABOND ADVANCED .7 DNX12 (GAUZE/BANDAGES/DRESSINGS) ×1 IMPLANT
DERMABOND ADVANCED .7 DNX6 (GAUZE/BANDAGES/DRESSINGS) IMPLANT
DRAPE C-ARM 42X120 X-RAY (DRAPES) ×2 IMPLANT
ELECT REM PT RETURN 9FT ADLT (ELECTROSURGICAL) ×2
ELECTRODE REM PT RTRN 9FT ADLT (ELECTROSURGICAL) ×1 IMPLANT
GLOVE SURG POLY MICRO LF SZ5.5 (GLOVE) ×2 IMPLANT
GLOVE SURG UNDER POLY LF SZ6 (GLOVE) ×2 IMPLANT
GOWN STRL REUS W/ TWL LRG LVL3 (GOWN DISPOSABLE) ×3 IMPLANT
GOWN STRL REUS W/TWL LRG LVL3 (GOWN DISPOSABLE) ×3
KIT BASIN OR (CUSTOM PROCEDURE TRAY) ×2 IMPLANT
KIT TURNOVER KIT B (KITS) ×2 IMPLANT
L-HOOK LAP DISP 36CM (ELECTROSURGICAL) ×2
LHOOK LAP DISP 36CM (ELECTROSURGICAL) ×1 IMPLANT
NDL INSUFFLATION 14GA 120MM (NEEDLE) IMPLANT
NEEDLE INSUFFLATION 14GA 120MM (NEEDLE) IMPLANT
NS IRRIG 1000ML POUR BTL (IV SOLUTION) ×2 IMPLANT
PAD ARMBOARD 7.5X6 YLW CONV (MISCELLANEOUS) ×2 IMPLANT
PENCIL BUTTON HOLSTER BLD 10FT (ELECTRODE) ×2 IMPLANT
POUCH SPECIMEN RETRIEVAL 10MM (ENDOMECHANICALS) ×2 IMPLANT
SCISSORS LAP 5X35 DISP (ENDOMECHANICALS) ×2 IMPLANT
SET CHOLANGIOGRAPH 5 50 .035 (SET/KITS/TRAYS/PACK) ×2 IMPLANT
SET IRRIG TUBING LAPAROSCOPIC (IRRIGATION / IRRIGATOR) ×2 IMPLANT
SET TUBE SMOKE EVAC HIGH FLOW (TUBING) ×2 IMPLANT
SLEEVE ENDOPATH XCEL 5M (ENDOMECHANICALS) ×4 IMPLANT
SPECIMEN JAR SMALL (MISCELLANEOUS) ×2 IMPLANT
SUT MNCRL AB 4-0 PS2 18 (SUTURE) ×2 IMPLANT
SUT VIC AB 3-0 SH 27 (SUTURE)
SUT VIC AB 3-0 SH 27XBRD (SUTURE) IMPLANT
SUT VICRYL 0 UR6 27IN ABS (SUTURE) ×2 IMPLANT
TOWEL GREEN STERILE (TOWEL DISPOSABLE) ×2 IMPLANT
TOWEL GREEN STERILE FF (TOWEL DISPOSABLE) ×2 IMPLANT
TRAY LAPAROSCOPIC MC (CUSTOM PROCEDURE TRAY) ×2 IMPLANT
TROCAR XCEL 12X100 BLDLESS (ENDOMECHANICALS) IMPLANT
TROCAR XCEL BLUNT TIP 100MML (ENDOMECHANICALS) ×2 IMPLANT
TROCAR XCEL NON-BLD 5MMX100MML (ENDOMECHANICALS) ×2 IMPLANT
WATER STERILE IRR 1000ML POUR (IV SOLUTION) ×2 IMPLANT

## 2021-06-18 NOTE — Anesthesia Procedure Notes (Addendum)
Procedure Name: Intubation ?Date/Time: 06/18/2021 4:52 PM ?Performed by: Aundria Rud, CRNA ?Pre-anesthesia Checklist: Patient identified, Emergency Drugs available, Suction available and Patient being monitored ?Patient Re-evaluated:Patient Re-evaluated prior to induction ?Oxygen Delivery Method: Circle System Utilized ?Preoxygenation: Pre-oxygenation with 100% oxygen ?Induction Type: IV induction, Rapid sequence and Cricoid Pressure applied ?Laryngoscope Size: Hyacinth Meeker and 2 ?Grade View: Grade I ?Tube type: Oral ?Tube size: 7.0 mm ?Number of attempts: 1 ?Airway Equipment and Method: Stylet ?Placement Confirmation: ETT inserted through vocal cords under direct vision, positive ETCO2 and breath sounds checked- equal and bilateral ?Secured at: 21 cm ?Tube secured with: Tape ?Dental Injury: Teeth and Oropharynx as per pre-operative assessment  ? ? ? ? ?

## 2021-06-18 NOTE — Interval H&P Note (Signed)
History and Physical Interval Note: ? ?06/18/2021 ?3:23 PM ? ?Lisa Bartlett  has presented today for surgery, with the diagnosis of GALLSTONES.  The various methods of treatment have been discussed with the patient and family. After consideration of risks, benefits and other options for treatment, the patient has consented to  Procedure(s): ?LAPAROSCOPIC CHOLECYSTECTOMY WITH INTRAOPERATIVE CHOLANGIOGRAM (N/A) as a surgical intervention.  The patient's history has been reviewed, patient examined, no change in status, stable for surgery.  I have reviewed the patient's chart and labs.  Questions were answered to the patient's satisfaction.   ? ? ?Dwan Bolt ? ? ?

## 2021-06-18 NOTE — Transfer of Care (Signed)
Immediate Anesthesia Transfer of Care Note ? ?Patient: Lisa Bartlett ? ?Procedure(s) Performed: LAPAROSCOPIC CHOLECYSTECTOMY WITH INTRAOPERATIVE CHOLANGIOGRAM ? ?Patient Location: PACU ? ?Anesthesia Type:General ? ?Level of Consciousness: drowsy ? ?Airway & Oxygen Therapy: Patient Spontanous Breathing and Patient connected to nasal cannula oxygen ? ?Post-op Assessment: Report given to RN and Post -op Vital signs reviewed and stable ? ?Post vital signs: Reviewed and stable ? ?Last Vitals:  ?Vitals Value Taken Time  ?BP 126/80 06/18/21 1821  ?Temp    ?Pulse 99 06/18/21 1824  ?Resp 19 06/18/21 1824  ?SpO2 99 % 06/18/21 1824  ?Vitals shown include unvalidated device data. ? ?Last Pain:  ?Vitals:  ? 06/18/21 1303  ?TempSrc:   ?PainSc: 0-No pain  ?   ? ?Patients Stated Pain Goal: 2 (06/17/21 1742) ? ?Complications: No notable events documented. ?

## 2021-06-18 NOTE — Op Note (Addendum)
Date: 06/18/21 ? ?Patient: Lisa Bartlett ?MRN: [id number] ? ?Preoperative Diagnosis: Symptomatic cholelithiasis ?Postoperative Diagnosis: Cholelithiasis with choledocholithiasis ? ?Procedure: Laparoscopic cholecystectomy with intraoperative cholangiogram ? ?Surgeon: Michaelle Birks, MD ? ?EBL: Minimal ? ?Anesthesia: General endotracheal ? ?Specimens: Gallbladder ? ?Indications: Lisa Bartlett is a 21 year old female who presented several days ago with severe epigastric abdominal pain.  She was found to have elevated LFTs with mild biliary ductal dilation on a CT scan.  She was admitted and her LFTs subsequently down trended, and her symptoms improved.  An MRCP did not show any common bile duct stones.  She followed up in clinic yesterday, and had recurrent abdominal pain with darkening of her urine.  After discussion of the risks and benefits of surgery, laparoscopic cholecystectomy was recommended and the patient agreed to proceed. ? ?Findings: Cholelithiasis without signs of acute cholecystitis.  Choledocholithiasis noted on intraoperative cholangiogram, with multiple retained stones even after administration of glucagon and multiple attempts to flush the bile duct. ? ?Procedure details: Informed consent was obtained in the preoperative area prior to the procedure. The patient was brought to the operating room and placed on the table in the supine position.  General anesthesia was induced and appropriate lines and drains were placed for intraoperative monitoring. Perioperative antibiotics were administered per SCIP guidelines. The abdomen was prepped and draped in the usual sterile fashion. A pre-procedure timeout was taken verifying patient identity, surgical site and procedure to be performed. ? ?A small infraumbilical skin incision was made, the subcutaneous tissue was divided with cautery, and the umbilical stalk was grasped and elevated. The fascia was incised and the peritoneal cavity was directly visualized. A  51mm Hassan trocar was placed and the abdomen was insufflated. The peritoneal cavity was inspected with no evidence of visceral or vascular injury. Three 71mm ports were placed in the right subcostal margin, all under direct visualization. The fundus of the gallbladder was grasped and retracted cephalad. The infundibulum was retracted laterally.  There were thin filmy adhesions of the gallbladder which were taken down with cautery.  The cystic triangle was dissected out using cautery and blunt dissection, and the critical view of safety was obtained.  The cystic artery was clipped and divided with cautery.  A posterior branch of the cystic duct was also clipped and divided.  The cystic duct was clipped close of the gallbladder, and a ductotomy was made just distal to the clip.  A cholangiocatheter was passed through the abdominal wall and used to cannulate the cystic duct.  A cholangiogram was performed.  There was filling of the entire common bile duct and common hepatic duct, and the left and right hepatic ducts.  There was also prompt filling of the duodenum with contrast.  Two filling defects were noted in the common bile duct at the takeoff of the cystic duct.  The catheter was flushed with saline multiple times and a repeat cholangiogram was performed.  Both filling defects persisted and were mobile.  Glucagon was administered and the bile ducts was again flushed with saline multiple times.  Another cholangiogram was performed and there remained multiple filling defects within the common bile duct, consistent with common duct stones.  The cholangiocatheter was removed, and 2 clips were placed distal to the ductotomy on the cystic duct.  The gallbladder was taken off the liver using cautery. The specimen was placed in an endocatch bag and removed. The surgical site was irrigated with saline until the effluent was clear. Hemostasis was achieved in the  gallbladder fossa using cautery. The cystic duct and artery  stumps were visually inspected and there was no evidence of bile leak or bleeding. The ports were removed under direct visualization and the abdomen was desufflated. The umbilical port site fascia was closed with a 0 vicryl suture. The skin at all port sites was closed with 4-0 monocryl subcuticular suture. Dermabond was applied. ? ?All counts were correct x2 at the end of the procedure. The patient was extubated and taken to PACU in stable condition.  The patient will be admitted and GI consulted for ERCP. ? ?Michaelle Birks, MD ?06/18/21 ?6:24 PM ? ? ?

## 2021-06-18 NOTE — Anesthesia Preprocedure Evaluation (Signed)
Anesthesia Evaluation  ?Patient identified by MRN, date of birth, ID band ?Patient awake ? ? ? ?Reviewed: ?Allergy & Precautions, NPO status , Patient's Chart, lab work & pertinent test results ? ?History of Anesthesia Complications ?Negative for: history of anesthetic complications ? ?Airway ?Mallampati: I ? ?TM Distance: >3 FB ?Neck ROM: Full ? ? ? Dental ? ?(+) Dental Advisory Given ?  ?Pulmonary ?neg pulmonary ROS,  ?  ?breath sounds clear to auscultation ? ? ? ? ? ? Cardiovascular ?negative cardio ROS ? ? ?Rhythm:Regular Rate:Normal ? ? ?  ?Neuro/Psych ?negative neurological ROS ?   ? GI/Hepatic ?Elevated LFTs ?N/v with acute cholecystitis ?  ?Endo/Other  ?negative endocrine ROS ? Renal/GU ?negative Renal ROS  ? ?  ?Musculoskeletal ? ? Abdominal ?  ?Peds ? Hematology ?negative hematology ROS ?(+)   ?Anesthesia Other Findings ? ? Reproductive/Obstetrics ? ?  ? ? ? ? ? ? ? ? ? ? ? ? ? ?  ?  ? ? ? ? ? ? ? ? ?Anesthesia Physical ?Anesthesia Plan ? ?ASA: 1 ? ?Anesthesia Plan: General  ? ?Post-op Pain Management: Toradol IV (intra-op)*  ? ?Induction: Intravenous ? ?PONV Risk Score and Plan: 3 and Ondansetron, Dexamethasone and Scopolamine patch - Pre-op ? ?Airway Management Planned: Oral ETT ? ?Additional Equipment: None ? ?Intra-op Plan:  ? ?Post-operative Plan: Extubation in OR ? ?Informed Consent:  ? ? ? ?Dental advisory given ? ?Plan Discussed with: CRNA and Surgeon ? ?Anesthesia Plan Comments:   ? ? ? ? ? ? ?Anesthesia Quick Evaluation ? ?

## 2021-06-18 NOTE — Anesthesia Postprocedure Evaluation (Signed)
Anesthesia Post Note ? ?Patient: Lisa Bartlett ? ?Procedure(s) Performed: LAPAROSCOPIC CHOLECYSTECTOMY WITH INTRAOPERATIVE CHOLANGIOGRAM ? ?  ? ?Patient location during evaluation: PACU ?Anesthesia Type: General ?Level of consciousness: awake and alert ?Pain management: pain level controlled ?Vital Signs Assessment: post-procedure vital signs reviewed and stable ?Respiratory status: spontaneous breathing, nonlabored ventilation, respiratory function stable and patient connected to nasal cannula oxygen ?Cardiovascular status: blood pressure returned to baseline and stable ?Postop Assessment: no apparent nausea or vomiting ?Anesthetic complications: no ? ? ?No notable events documented. ? ?Last Vitals:  ?Vitals:  ? 06/18/21 1935 06/18/21 1954  ?BP: 116/73 129/71  ?Pulse: 72 85  ?Resp: 14 15  ?Temp: 36.7 ?C 36.9 ?C  ?SpO2: 96% 100%  ?  ?Last Pain:  ?Vitals:  ? 06/18/21 1954  ?TempSrc: Oral  ?PainSc:   ? ? ?  ?  ?  ?  ?  ?  ? ?Shawntez Dickison S ? ? ? ? ?

## 2021-06-19 ENCOUNTER — Encounter (HOSPITAL_COMMUNITY): Payer: Self-pay | Admitting: Surgery

## 2021-06-19 DIAGNOSIS — K8061 Calculus of gallbladder and bile duct with cholecystitis, unspecified, with obstruction: Secondary | ICD-10-CM | POA: Diagnosis present

## 2021-06-19 DIAGNOSIS — K838 Other specified diseases of biliary tract: Secondary | ICD-10-CM | POA: Diagnosis not present

## 2021-06-19 DIAGNOSIS — K8050 Calculus of bile duct without cholangitis or cholecystitis without obstruction: Secondary | ICD-10-CM | POA: Diagnosis not present

## 2021-06-19 LAB — COMPREHENSIVE METABOLIC PANEL
ALT: 137 U/L — ABNORMAL HIGH (ref 0–44)
AST: 48 U/L — ABNORMAL HIGH (ref 15–41)
Albumin: 3.4 g/dL — ABNORMAL LOW (ref 3.5–5.0)
Alkaline Phosphatase: 122 U/L (ref 38–126)
Anion gap: 9 (ref 5–15)
BUN: 8 mg/dL (ref 6–20)
CO2: 20 mmol/L — ABNORMAL LOW (ref 22–32)
Calcium: 8.9 mg/dL (ref 8.9–10.3)
Chloride: 105 mmol/L (ref 98–111)
Creatinine, Ser: 0.66 mg/dL (ref 0.44–1.00)
GFR, Estimated: >60 mL/min (ref >60)
Glucose, Bld: 116 mg/dL — ABNORMAL HIGH (ref 70–99)
Potassium: 4.4 mmol/L (ref 3.5–5.1)
Sodium: 134 mmol/L — ABNORMAL LOW (ref 135–145)
Total Bilirubin: 1.1 mg/dL (ref 0.3–1.2)
Total Protein: 6.6 g/dL (ref 6.5–8.1)

## 2021-06-19 MED ORDER — SODIUM CHLORIDE 0.9 % IV SOLN: INTRAVENOUS | Status: DC

## 2021-06-19 NOTE — Progress Notes (Signed)
? ? ?  1 Day Post-Op  ?Subjective: ?No acute issues. Afebrile, vitals stable. Tbili 1.1. ? ?Objective: ?Vital signs in last 24 hours: ?Temp:  [98 ?F (36.7 ?C)-98.6 ?F (37 ?C)] 98.1 ?F (36.7 ?C) (04/07 0533) ?Pulse Rate:  [62-108] 92 (04/07 0533) ?Resp:  [14-20] 16 (04/07 0533) ?BP: (111-136)/(61-82) 111/61 (04/07 0533) ?SpO2:  [96 %-100 %] 98 % (04/07 0527) ?Weight:  [69.4 kg] 69.4 kg (04/06 1251) ?Last BM Date : 06/18/21 ? ?Intake/Output from previous day: ?04/06 0701 - 04/07 0700 ?In: 1500 [I.V.:1400; IV Piggyback:100] ?Out: -  ?Intake/Output this shift: ?No intake/output data recorded. ? ?PE: ?General: resting comfortably, NAD ?Neuro: alert and oriented, no focal deficits ?Resp: normal work of breathing on room air ?Abdomen: soft, nondistended, nontender to palpation. Incisions clean and dry with no erythema or induration. ?Extremities: warm and well-perfused ? ? ?Lab Results:  ?Recent Labs  ?  06/18/21 ?2116  ?WBC 13.5*  ?HGB 13.1  ?HCT 38.2  ?PLT 294  ? ?BMET ?Recent Labs  ?  06/18/21 ?2116 06/19/21 ?0055  ?NA 134* 134*  ?K 3.9 4.4  ?CL 106 105  ?CO2 20* 20*  ?GLUCOSE 97 116*  ?BUN 8 8  ?CREATININE 0.69 0.66  ?CALCIUM 8.8* 8.9  ? ?PT/INR ?No results for input(s): LABPROT, INR in the last 72 hours. ?CMP  ?   ?Component Value Date/Time  ? NA 134 (L) 06/19/2021 0055  ? K 4.4 06/19/2021 0055  ? CL 105 06/19/2021 0055  ? CO2 20 (L) 06/19/2021 0055  ? GLUCOSE 116 (H) 06/19/2021 0055  ? BUN 8 06/19/2021 0055  ? CREATININE 0.66 06/19/2021 0055  ? CALCIUM 8.9 06/19/2021 0055  ? PROT 6.6 06/19/2021 0055  ? ALBUMIN 3.4 (L) 06/19/2021 0055  ? AST 48 (H) 06/19/2021 0055  ? ALT 137 (H) 06/19/2021 0055  ? ALKPHOS 122 06/19/2021 0055  ? BILITOT 1.1 06/19/2021 0055  ? GFRNONAA >60 06/19/2021 0055  ? ?Lipase  ?   ?Component Value Date/Time  ? LIPASE 52 (H) 06/15/2021 0458  ? ? ? ? ? ?Studies/Results: ?DG Cholangiogram Operative ? ?Result Date: 06/18/2021 ?CLINICAL DATA:  Cholecystectomy for symptomatic cholelithiasis. EXAM:  INTRAOPERATIVE CHOLANGIOGRAM TECHNIQUE: Cholangiographic images from the C-arm fluoroscopic device were submitted for interpretation post-operatively. Please see the procedural report for the amount of contrast and the fluoroscopy time utilized. FLUOROSCOPY: Radiation Exposure Index (as provided by the fluoroscopic device): 6.4 mGy Kerma COMPARISON:  MRI on 06/15/2021 FINDINGS: Intraoperative imaging with a C-arm demonstrates smooth ovoid filling defects within the common bile duct with a larger persistent filling defects seen on 2 contrast injections and some smaller filling defects inferior to the larger defect. The common bile duct itself is not significantly dilated with some potential mild dilatation of the proper hepatic duct. Contrast enters the duodenum normally. IMPRESSION: Evidence of choledocholithiasis with persistent filling defects identified in the common bile duct. Electronically Signed   By: Irish Lack M.D.   On: 06/18/2021 18:40   ? ? ? ? ?Assessment/Plan ?21 yo female with prior choledocholithiasis, POD1 s/p laparoscopic cholecystectomy with intraoperative cholangiogram, which showed recurrent non-obstructive choledocholithiasis. ?- GI consulted for ERCP ?- NPO for possible procedure ?- Pain control: scheduled tylenol, prn oxycodone ?- VTE: SCDs, ambulate ?- Dispo: inpatient, med-surg floor  ? ? ? LOS: 0 days  ? ? ?Sophronia Simas, MD ?[facility] ?General, Hepatobiliary and Pancreatic Surgery ?06/19/21 7:35 AM ? ?

## 2021-06-19 NOTE — Progress Notes (Signed)
Answering service called for diet order. ?

## 2021-06-19 NOTE — H&P (View-Only) (Signed)
Reason for Consult: Choledocholithiasis ?Referring Physician: CCS ? ?Jackelyn Hoehn Machuca ?HPI: This is a 21 year old female who is post partum x 5 months and s/p lap chole with findings of choledocholithiasis.  She was originally evaluated a few days ago at an ER in [place], [place] and there was evidence of choledocholithiasis.  The patient was transferred to [facility] and she was evaluated by [facility].  Blood work and imaging showed that she most likely passed a stone.  Upon discharge on 06/17/2021 her liver enzymes were declining, but they never normalized.  She was evaluated by Dr. Freida Busman as an outpatient and she complained of continued epigastric pain. The cholecystectomy was performed today and the IOC was positive for stones. ? ?History reviewed. No pertinent past medical history. ? ?Past Surgical History:  ?Procedure Laterality Date  ? CESAREAN SECTION  01/03/2021  ? CHOLECYSTECTOMY N/A 06/18/2021  ? Procedure: LAPAROSCOPIC CHOLECYSTECTOMY WITH INTRAOPERATIVE CHOLANGIOGRAM;  Surgeon: Fritzi Mandes, MD;  Location: [facility] OR;  Service: General;  Laterality: N/A;  ? ? ?Family History  ?Problem Relation Age of Onset  ? Liver disease Neg Hx   ? ? ?Social History:  reports that she has never smoked. She has never used smokeless tobacco. She reports that she does not drink alcohol and does not use drugs. ? ?Allergies: No Known Allergies ? ?Medications: Scheduled: ? acetaminophen  1,000 mg Oral Q6H  ? docusate sodium  100 mg Oral BID  ? scopolamine  1 patch Transdermal Q72H  ? ?Continuous: ? ?Results for orders placed or performed during the hospital encounter of 06/18/21 (from the past 24 hour(s))  ?Pregnancy, urine POC     Status: None  ? Collection Time: 06/18/21  1:16 PM  ?Result Value Ref Range  ? Preg Test, Ur NEGATIVE NEGATIVE  ?Comprehensive metabolic panel     Status: Abnormal  ? Collection Time: 06/18/21  9:16 PM  ?Result Value Ref Range  ? Sodium 134 (L) 135 - 145 mmol/L  ? Potassium 3.9 3.5 - 5.1 mmol/L  ?  Chloride 106 98 - 111 mmol/L  ? CO2 20 (L) 22 - 32 mmol/L  ? Glucose, Bld 97 70 - 99 mg/dL  ? BUN 8 6 - 20 mg/dL  ? Creatinine, Ser 0.69 0.44 - 1.00 mg/dL  ? Calcium 8.8 (L) 8.9 - 10.3 mg/dL  ? Total Protein 6.4 (L) 6.5 - 8.1 g/dL  ? Albumin 3.5 3.5 - 5.0 g/dL  ? AST 56 (H) 15 - 41 U/L  ? ALT 145 (H) 0 - 44 U/L  ? Alkaline Phosphatase 133 (H) 38 - 126 U/L  ? Total Bilirubin 1.1 0.3 - 1.2 mg/dL  ? GFR, Estimated >60 >60 mL/min  ? Anion gap 8 5 - 15  ?CBC     Status: Abnormal  ? Collection Time: 06/18/21  9:16 PM  ?Result Value Ref Range  ? WBC 13.5 (H) 4.0 - 10.5 K/uL  ? RBC 4.71 3.87 - 5.11 MIL/uL  ? Hemoglobin 13.1 12.0 - 15.0 g/dL  ? HCT 38.2 36.0 - 46.0 %  ? MCV 81.1 80.0 - 100.0 fL  ? MCH 27.8 26.0 - 34.0 pg  ? MCHC 34.3 30.0 - 36.0 g/dL  ? RDW 13.6 11.5 - 15.5 %  ? Platelets 294 150 - 400 K/uL  ? nRBC 0.0 0.0 - 0.2 %  ?Comprehensive metabolic panel     Status: Abnormal  ? Collection Time: 06/19/21 12:55 AM  ?Result Value Ref Range  ? Sodium  134 (L) 135 - 145 mmol/L  ? Potassium 4.4 3.5 - 5.1 mmol/L  ? Chloride 105 98 - 111 mmol/L  ? CO2 20 (L) 22 - 32 mmol/L  ? Glucose, Bld 116 (H) 70 - 99 mg/dL  ? BUN 8 6 - 20 mg/dL  ? Creatinine, Ser 0.66 0.44 - 1.00 mg/dL  ? Calcium 8.9 8.9 - 10.3 mg/dL  ? Total Protein 6.6 6.5 - 8.1 g/dL  ? Albumin 3.4 (L) 3.5 - 5.0 g/dL  ? AST 48 (H) 15 - 41 U/L  ? ALT 137 (H) 0 - 44 U/L  ? Alkaline Phosphatase 122 38 - 126 U/L  ? Total Bilirubin 1.1 0.3 - 1.2 mg/dL  ? GFR, Estimated >60 >60 mL/min  ? Anion gap 9 5 - 15  ?  ? ?DG Cholangiogram Operative ? ?Result Date: 06/18/2021 ?CLINICAL DATA:  Cholecystectomy for symptomatic cholelithiasis. EXAM: INTRAOPERATIVE CHOLANGIOGRAM TECHNIQUE: Cholangiographic images from the C-arm fluoroscopic device were submitted for interpretation post-operatively. Please see the procedural report for the amount of contrast and the fluoroscopy time utilized. FLUOROSCOPY: Radiation Exposure Index (as provided by the fluoroscopic device): 6.4 mGy Kerma  COMPARISON:  MRI on 06/15/2021 FINDINGS: Intraoperative imaging with a C-arm demonstrates smooth ovoid filling defects within the common bile duct with a larger persistent filling defects seen on 2 contrast injections and some smaller filling defects inferior to the larger defect. The common bile duct itself is not significantly dilated with some potential mild dilatation of the proper hepatic duct. Contrast enters the duodenum normally. IMPRESSION: Evidence of choledocholithiasis with persistent filling defects identified in the common bile duct. Electronically Signed   By: Irish Lack M.D.   On: 06/18/2021 18:40   ? ?ROS:  As stated above in the HPI otherwise negative. ? ?Blood pressure 139/78, pulse 77, temperature 99.3 ?F (37.4 ?C), temperature source Oral, resp. rate 17, height 5\' 1"  (1.549 m), weight 69.4 kg, last menstrual period 05/07/2021, SpO2 98 %.   ? ?PE: ?Gen: NAD, Alert and Oriented ?HEENT:  [place]/AT, EOMI ?Neck: Supple, no LAD ?Lungs: CTA Bilaterally ?CV: RRR without M/G/R ?ABD: Soft, tender at the incision sites, +BS ?Ext: No C/C/E ? ?Assessment/Plan: ?1) Choledocholithiasis. ?2) S/p lap chole. ?3) Epigastric pain. ? ? The procedure, risks, and benefits were discussed with her.  The main risks of bleeding, infection, perforation, and pancreatitis were discussed.  She understands and wants to proceed with the procedure tomorrow. ? ?Plan: ?1) ERCP with stone extraction tomorrow. ? ?Jaedan Schuman D ?06/19/2021, 9:51 AM  ? ? ?  ?

## 2021-06-19 NOTE — Progress Notes (Signed)
Mobility Specialist Progress Note  ? ? 06/19/21 1254  ?Mobility  ?Activity Ambulated independently in hallway  ?Level of Assistance Modified independent, requires aide device or extra time  ?Assistive Device Other (Comment) ?(IV pole)  ?Distance Ambulated (ft) 1650 ft  ?Activity Response Tolerated well  ?$Mobility charge 1 Mobility  ? ?Pt received in bed and agreeable. No complaints on walk. Returned to bed with call bell in reach. RN agreeable to allow pt to get up on her own to ambulate. Pt aware.  ?   ?[place] ?Mobility Specialist  ?  ?

## 2021-06-19 NOTE — Progress Notes (Signed)
Pt instructed on IS and deep breathing, SCDs on and operating. Pt taking tylenol for pain. Called ENDO and they have her scheduled for tomorrow, 4/8 at 1030, permit signed. Mobility Tech, Jillian in assisting pt to ambulate in the hallway. Called to see if pt can have some liquids or something to eat today until NPO tonight at [facility].  ?

## 2021-06-19 NOTE — Consult Note (Signed)
Reason for Consult: Choledocholithiasis ?Referring Physician: CCS ? ?Lisa Bartlett ?HPI: This is a 21 year old female who is post partum x 5 months and s/p lap chole with findings of choledocholithiasis.  She was originally evaluated a few days ago at an ER in [place], [place] and there was evidence of choledocholithiasis.  The patient was transferred to [facility] and she was evaluated by [facility].  Blood work and imaging showed that she most likely passed a stone.  Upon discharge on 06/17/2021 her liver enzymes were declining, but they never normalized.  She was evaluated by Dr. Allen as an outpatient and she complained of continued epigastric pain. The cholecystectomy was performed today and the IOC was positive for stones. ? ?History reviewed. No pertinent past medical history. ? ?Past Surgical History:  ?Procedure Laterality Date  ? CESAREAN SECTION  01/03/2021  ? CHOLECYSTECTOMY N/A 06/18/2021  ? Procedure: LAPAROSCOPIC CHOLECYSTECTOMY WITH INTRAOPERATIVE CHOLANGIOGRAM;  Surgeon: Allen, Shelby L, MD;  Location: [facility] OR;  Service: General;  Laterality: N/A;  ? ? ?Family History  ?Problem Relation Age of Onset  ? Liver disease Neg Hx   ? ? ?Social History:  reports that she has never smoked. She has never used smokeless tobacco. She reports that she does not drink alcohol and does not use drugs. ? ?Allergies: No Known Allergies ? ?Medications: Scheduled: ? acetaminophen  1,000 mg Oral Q6H  ? docusate sodium  100 mg Oral BID  ? scopolamine  1 patch Transdermal Q72H  ? ?Continuous: ? ?Results for orders placed or performed during the hospital encounter of 06/18/21 (from the past 24 hour(s))  ?Pregnancy, urine POC     Status: None  ? Collection Time: 06/18/21  1:16 PM  ?Result Value Ref Range  ? Preg Test, Ur NEGATIVE NEGATIVE  ?Comprehensive metabolic panel     Status: Abnormal  ? Collection Time: 06/18/21  9:16 PM  ?Result Value Ref Range  ? Sodium 134 (L) 135 - 145 mmol/L  ? Potassium 3.9 3.5 - 5.1 mmol/L  ?  Chloride 106 98 - 111 mmol/L  ? CO2 20 (L) 22 - 32 mmol/L  ? Glucose, Bld 97 70 - 99 mg/dL  ? BUN 8 6 - 20 mg/dL  ? Creatinine, Ser 0.69 0.44 - 1.00 mg/dL  ? Calcium 8.8 (L) 8.9 - 10.3 mg/dL  ? Total Protein 6.4 (L) 6.5 - 8.1 g/dL  ? Albumin 3.5 3.5 - 5.0 g/dL  ? AST 56 (H) 15 - 41 U/L  ? ALT 145 (H) 0 - 44 U/L  ? Alkaline Phosphatase 133 (H) 38 - 126 U/L  ? Total Bilirubin 1.1 0.3 - 1.2 mg/dL  ? GFR, Estimated >60 >60 mL/min  ? Anion gap 8 5 - 15  ?CBC     Status: Abnormal  ? Collection Time: 06/18/21  9:16 PM  ?Result Value Ref Range  ? WBC 13.5 (H) 4.0 - 10.5 K/uL  ? RBC 4.71 3.87 - 5.11 MIL/uL  ? Hemoglobin 13.1 12.0 - 15.0 g/dL  ? HCT 38.2 36.0 - 46.0 %  ? MCV 81.1 80.0 - 100.0 fL  ? MCH 27.8 26.0 - 34.0 pg  ? MCHC 34.3 30.0 - 36.0 g/dL  ? RDW 13.6 11.5 - 15.5 %  ? Platelets 294 150 - 400 K/uL  ? nRBC 0.0 0.0 - 0.2 %  ?Comprehensive metabolic panel     Status: Abnormal  ? Collection Time: 06/19/21 12:55 AM  ?Result Value Ref Range  ? Sodium   134 (L) 135 - 145 mmol/L  ? Potassium 4.4 3.5 - 5.1 mmol/L  ? Chloride 105 98 - 111 mmol/L  ? CO2 20 (L) 22 - 32 mmol/L  ? Glucose, Bld 116 (H) 70 - 99 mg/dL  ? BUN 8 6 - 20 mg/dL  ? Creatinine, Ser 0.66 0.44 - 1.00 mg/dL  ? Calcium 8.9 8.9 - 10.3 mg/dL  ? Total Protein 6.6 6.5 - 8.1 g/dL  ? Albumin 3.4 (L) 3.5 - 5.0 g/dL  ? AST 48 (H) 15 - 41 U/L  ? ALT 137 (H) 0 - 44 U/L  ? Alkaline Phosphatase 122 38 - 126 U/L  ? Total Bilirubin 1.1 0.3 - 1.2 mg/dL  ? GFR, Estimated >60 >60 mL/min  ? Anion gap 9 5 - 15  ?  ? ?DG Cholangiogram Operative ? ?Result Date: 06/18/2021 ?CLINICAL DATA:  Cholecystectomy for symptomatic cholelithiasis. EXAM: INTRAOPERATIVE CHOLANGIOGRAM TECHNIQUE: Cholangiographic images from the C-arm fluoroscopic device were submitted for interpretation post-operatively. Please see the procedural report for the amount of contrast and the fluoroscopy time utilized. FLUOROSCOPY: Radiation Exposure Index (as provided by the fluoroscopic device): 6.4 mGy Kerma  COMPARISON:  MRI on 06/15/2021 FINDINGS: Intraoperative imaging with a C-arm demonstrates smooth ovoid filling defects within the common bile duct with a larger persistent filling defects seen on 2 contrast injections and some smaller filling defects inferior to the larger defect. The common bile duct itself is not significantly dilated with some potential mild dilatation of the proper hepatic duct. Contrast enters the duodenum normally. IMPRESSION: Evidence of choledocholithiasis with persistent filling defects identified in the common bile duct. Electronically Signed   By: Irish Lack M.D.   On: 06/18/2021 18:40   ? ?ROS:  As stated above in the HPI otherwise negative. ? ?Blood pressure 139/78, pulse 77, temperature 99.3 ?F (37.4 ?C), temperature source Oral, resp. rate 17, height 5\' 1"  (1.549 m), weight 69.4 kg, last menstrual period 05/07/2021, SpO2 98 %.   ? ?PE: ?Gen: NAD, Alert and Oriented ?HEENT:  [place]/AT, EOMI ?Neck: Supple, no LAD ?Lungs: CTA Bilaterally ?CV: RRR without M/G/R ?ABD: Soft, tender at the incision sites, +BS ?Ext: No C/C/E ? ?Assessment/Plan: ?1) Choledocholithiasis. ?2) S/p lap chole. ?3) Epigastric pain. ? ? The procedure, risks, and benefits were discussed with her.  The main risks of bleeding, infection, perforation, and pancreatitis were discussed.  She understands and wants to proceed with the procedure tomorrow. ? ?Plan: ?1) ERCP with stone extraction tomorrow. ? ?[name] D ?06/19/2021, 9:51 AM  ? ? ?  ?

## 2021-06-20 ENCOUNTER — Inpatient Hospital Stay (HOSPITAL_COMMUNITY): Payer: Medicaid Other | Admitting: Certified Registered"

## 2021-06-20 ENCOUNTER — Encounter (HOSPITAL_COMMUNITY)
Admission: AD | Disposition: A | Discharge status: Home / Self Care | Payer: Self-pay | Source: Home / Self Care | Attending: Surgery

## 2021-06-20 ENCOUNTER — Encounter (HOSPITAL_COMMUNITY): Payer: Self-pay | Admitting: Surgery

## 2021-06-20 ENCOUNTER — Inpatient Hospital Stay (HOSPITAL_COMMUNITY): Payer: Medicaid Other

## 2021-06-20 DIAGNOSIS — K8050 Calculus of bile duct without cholangitis or cholecystitis without obstruction: Secondary | ICD-10-CM

## 2021-06-20 DIAGNOSIS — K838 Other specified diseases of biliary tract: Secondary | ICD-10-CM

## 2021-06-20 HISTORY — PX: SPHINCTEROTOMY: SHX5544

## 2021-06-20 HISTORY — PX: ERCP: SHX5425

## 2021-06-20 IMAGING — RF DG ERCP WO/W SPHINCTEROTOMY
1 series · 3 of 3 positions shown · non-contrast
Comparison: [DATE]

CLINICAL DATA: Choledocholithiasis on intra op cholangiogram

EXAM:
ERCP
TECHNIQUE: Multiple spot images obtained with the fluoroscopic device and
submitted for interpretation post-procedure.

[Series 1: unknown protocol · 0.20mm/px · 3 of 3 slices shown]
[im 1/3]
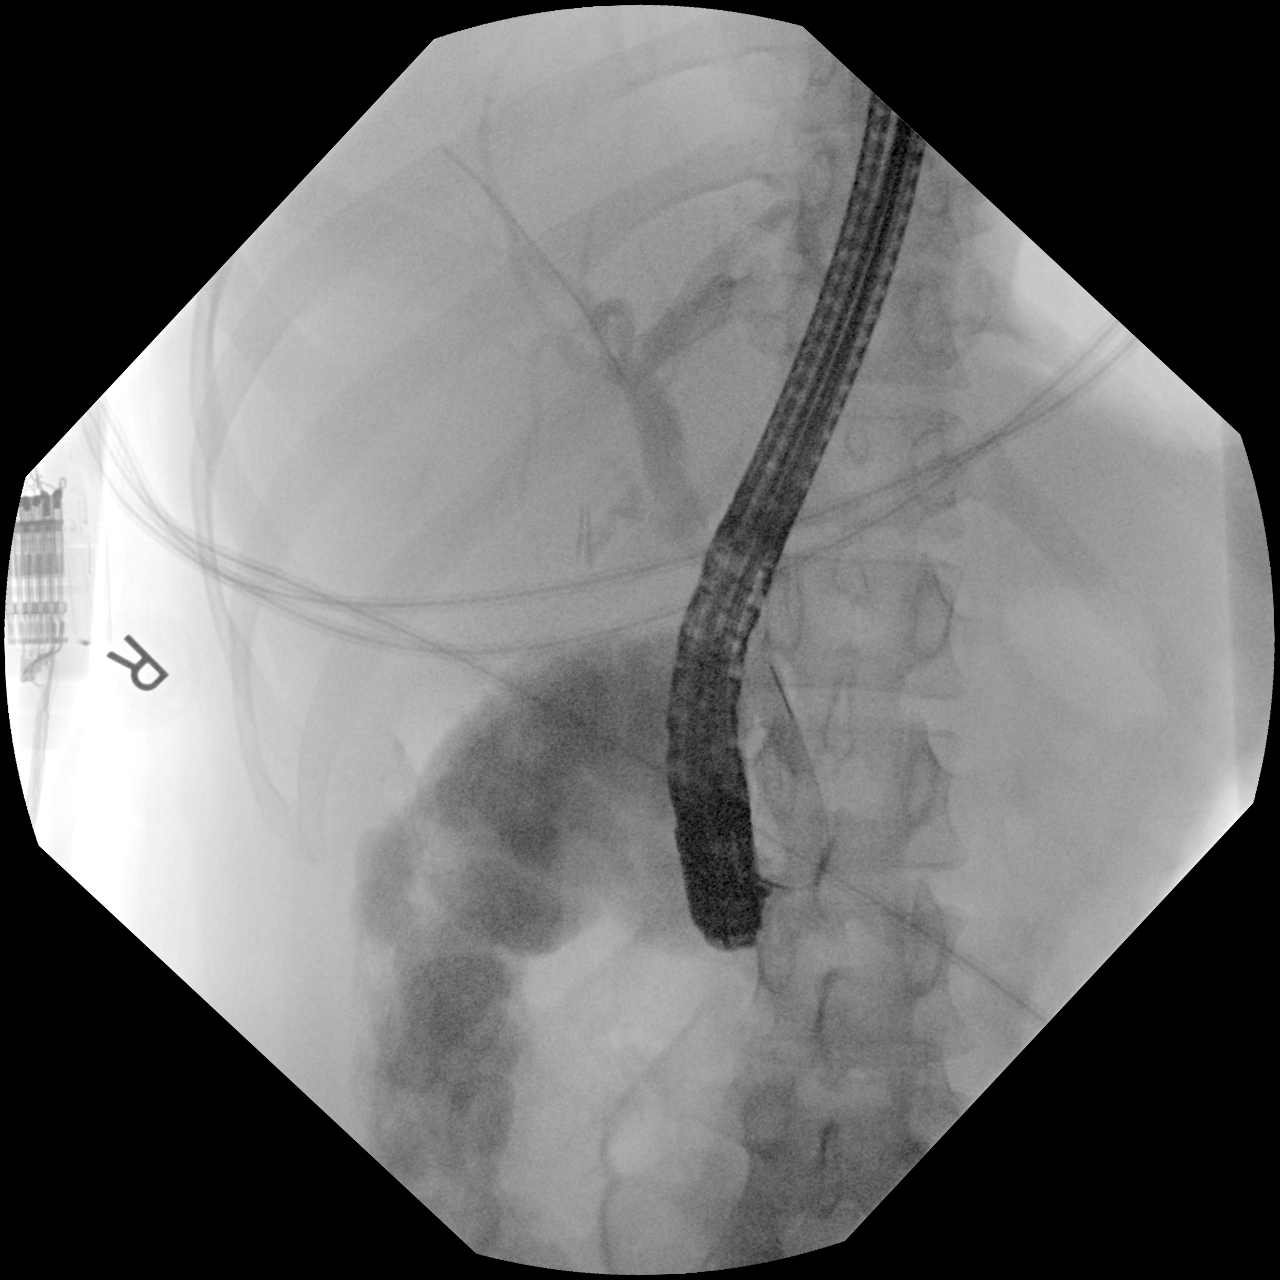
[im 2/3]
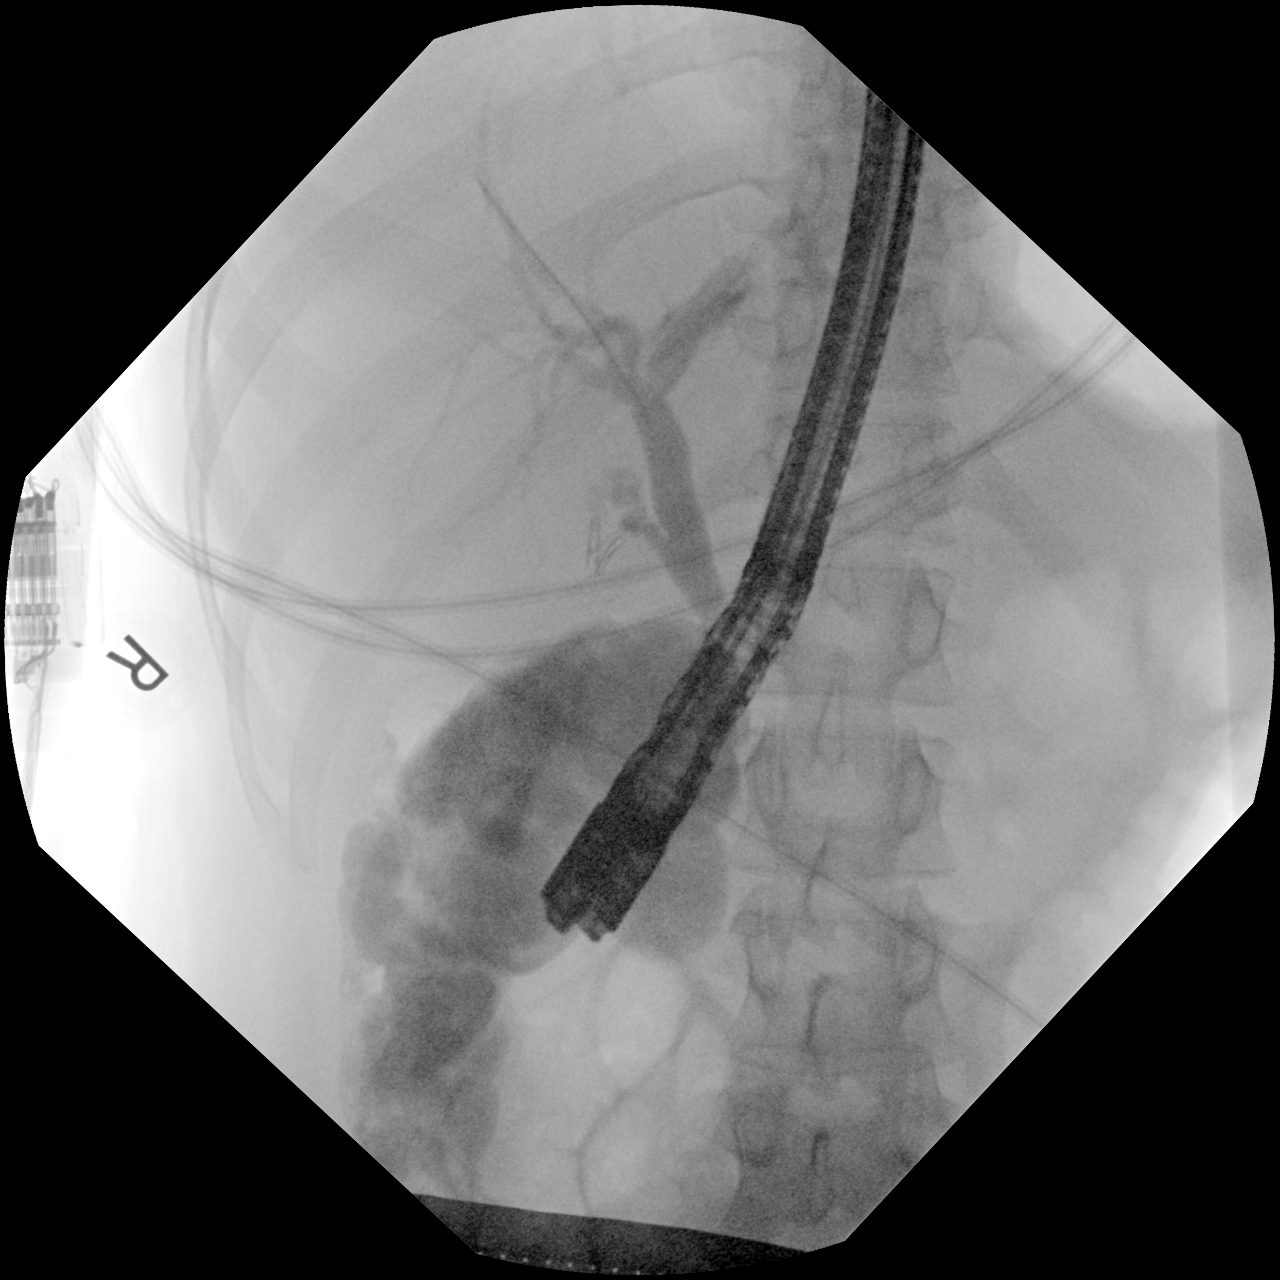
[im 3/3]
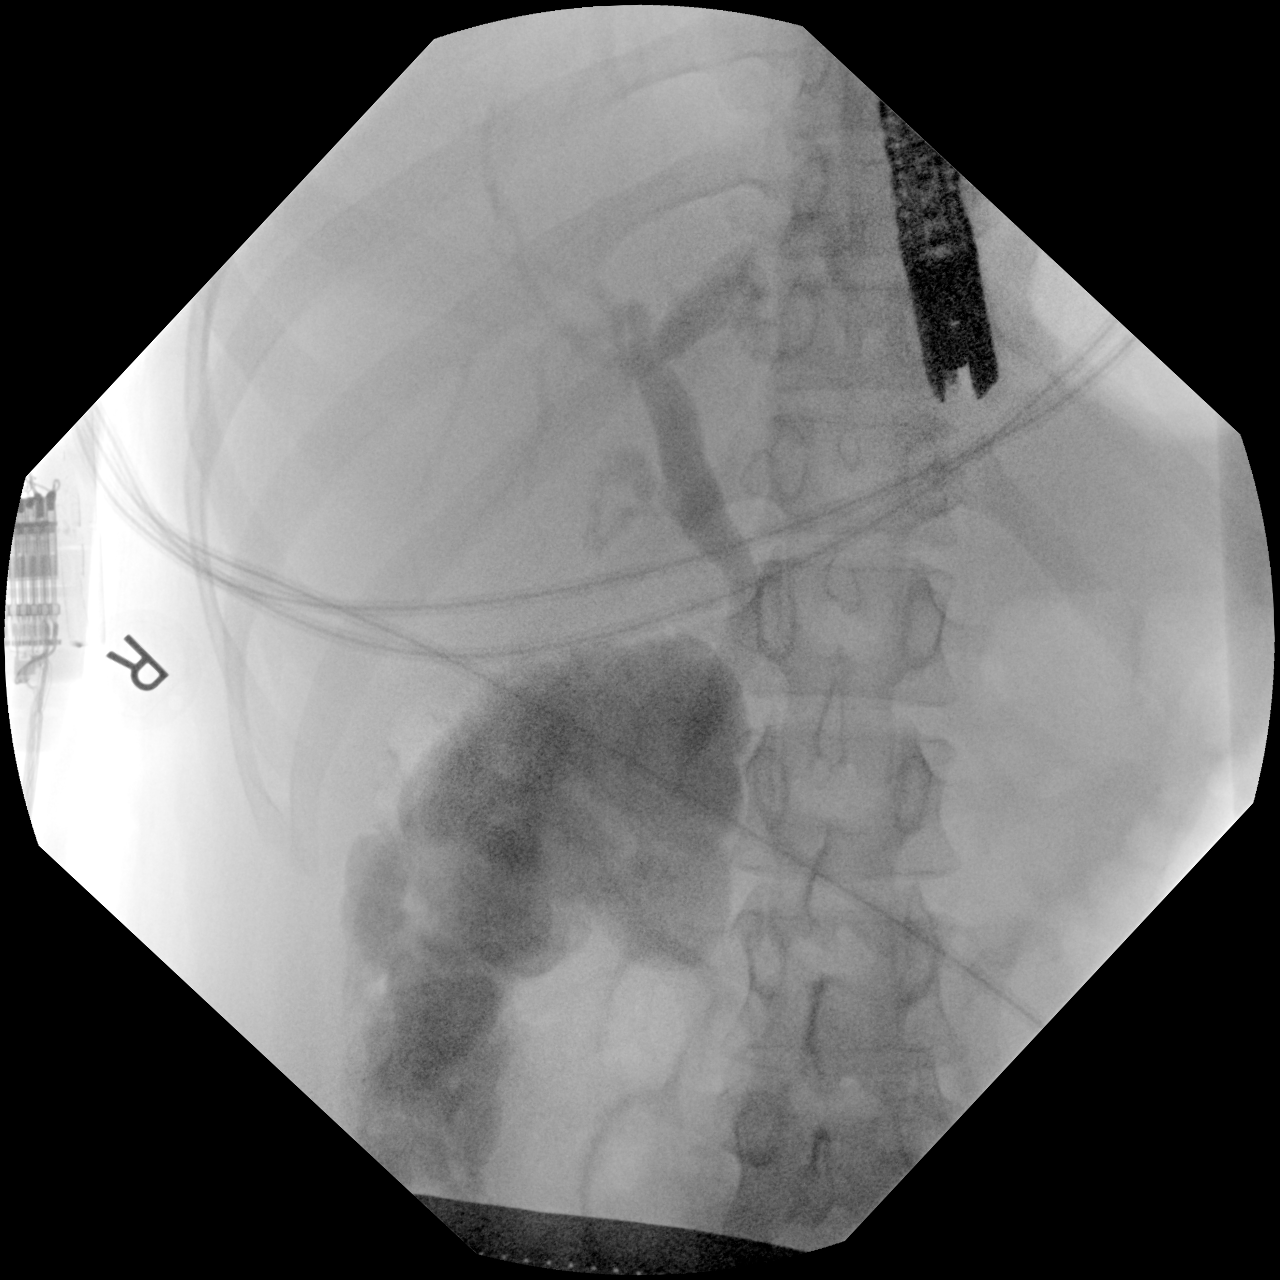

[3 of 3 positions shown; findings below may reference images not displayed]

FINDINGS: A series of fluoroscopic spot images document endoscopic cannulation
and opacification of the CBD. The intrahepatic biliary tree is
incompletely opacified, appearing relatively decompressed centrally.
Cholecystectomy clips are noted. No extravasation identified.
IMPRESSION: Endoscopic CBD intervention.  See operators note for details.

These images were submitted for radiologic interpretation only.
Please see the procedural report for the amount of contrast and the
fluoroscopy time utilized.

## 2021-06-20 SURGERY — ERCP, WITH INTERVENTION IF INDICATED
Anesthesia: General

## 2021-06-20 MED ORDER — FENTANYL CITRATE (PF) 100 MCG/2ML IJ SOLN: 25.0000 ug | INTRAMUSCULAR | Status: DC | PRN

## 2021-06-20 MED ORDER — MIDAZOLAM HCL 2 MG/2ML IJ SOLN: 0.5000 mg | Freq: Once | INTRAMUSCULAR | Status: DC | PRN

## 2021-06-20 MED ORDER — SCOPOLAMINE 1 MG/3DAYS TD PT72
MEDICATED_PATCH | TRANSDERMAL | Status: AC
  Filled 2021-06-20: qty 1

## 2021-06-20 MED ORDER — PROPOFOL 10 MG/ML IV BOLUS
INTRAVENOUS | Status: DC | PRN
  Administered 2021-06-20: 200 mg via INTRAVENOUS

## 2021-06-20 MED ORDER — CIPROFLOXACIN IN D5W 400 MG/200ML IV SOLN
INTRAVENOUS | Status: DC | PRN
  Administered 2021-06-20: 400 mg via INTRAVENOUS

## 2021-06-20 MED ORDER — LACTATED RINGERS IV SOLN: INTRAVENOUS | Status: DC | PRN

## 2021-06-20 MED ORDER — MIDAZOLAM HCL 2 MG/2ML IJ SOLN
INTRAMUSCULAR | Status: DC | PRN
  Administered 2021-06-20: 2 mg via INTRAVENOUS

## 2021-06-20 MED ORDER — OXYCODONE HCL 5 MG PO TABS: 5.0000 mg | ORAL_TABLET | Freq: Once | ORAL | Status: DC | PRN

## 2021-06-20 MED ORDER — MEPERIDINE HCL 25 MG/ML IJ SOLN: 6.2500 mg | INTRAMUSCULAR | Status: DC | PRN

## 2021-06-20 MED ORDER — ONDANSETRON HCL 4 MG/2ML IJ SOLN
INTRAMUSCULAR | Status: DC | PRN
  Administered 2021-06-20: 4 mg via INTRAVENOUS

## 2021-06-20 MED ORDER — SUGAMMADEX SODIUM 200 MG/2ML IV SOLN
INTRAVENOUS | Status: DC | PRN
  Administered 2021-06-20: 400 mg via INTRAVENOUS

## 2021-06-20 MED ORDER — SCOPOLAMINE 1 MG/3DAYS TD PT72
MEDICATED_PATCH | TRANSDERMAL | Status: DC | PRN
  Administered 2021-06-20: 1 via TRANSDERMAL

## 2021-06-20 MED ORDER — GLUCAGON HCL RDNA (DIAGNOSTIC) 1 MG IJ SOLR
INTRAMUSCULAR | Status: AC
  Filled 2021-06-20: qty 1

## 2021-06-20 MED ORDER — OXYCODONE HCL 5 MG/5ML PO SOLN: 5.0000 mg | Freq: Once | ORAL | Status: DC | PRN

## 2021-06-20 MED ORDER — FENTANYL CITRATE (PF) 100 MCG/2ML IJ SOLN
INTRAMUSCULAR | Status: DC | PRN
  Administered 2021-06-20: 100 ug via INTRAVENOUS

## 2021-06-20 MED ORDER — SODIUM CHLORIDE 0.9 % IV SOLN
INTRAVENOUS | Status: DC | PRN
  Administered 2021-06-20: 25 mL

## 2021-06-20 MED ORDER — INDOMETHACIN 50 MG RE SUPP
RECTAL | Status: AC
  Filled 2021-06-20: qty 2

## 2021-06-20 MED ORDER — LIDOCAINE 2% (20 MG/ML) 5 ML SYRINGE
INTRAMUSCULAR | Status: DC | PRN
  Administered 2021-06-20: 20 mg via INTRAVENOUS

## 2021-06-20 MED ORDER — DEXAMETHASONE SODIUM PHOSPHATE 10 MG/ML IJ SOLN
INTRAMUSCULAR | Status: DC | PRN
  Administered 2021-06-20: 10 mg via INTRAVENOUS

## 2021-06-20 MED ORDER — PROMETHAZINE HCL 25 MG/ML IJ SOLN: 6.2500 mg | INTRAMUSCULAR | Status: DC | PRN

## 2021-06-20 MED ORDER — CIPROFLOXACIN IN D5W 400 MG/200ML IV SOLN
INTRAVENOUS | Status: AC
  Filled 2021-06-20: qty 200

## 2021-06-20 MED ORDER — ROCURONIUM BROMIDE 10 MG/ML (PF) SYRINGE
PREFILLED_SYRINGE | INTRAVENOUS | Status: DC | PRN
  Administered 2021-06-20: 20 mg via INTRAVENOUS
  Administered 2021-06-20: 50 mg via INTRAVENOUS

## 2021-06-20 NOTE — Anesthesia Procedure Notes (Signed)
Procedure Name: Intubation ?Date/Time: 06/20/2021 10:29 AM ?Performed by: Leonor Liv, CRNA ?Pre-anesthesia Checklist: Patient identified, Emergency Drugs available, Suction available and Patient being monitored ?Patient Re-evaluated:Patient Re-evaluated prior to induction ?Oxygen Delivery Method: Circle System Utilized ?Preoxygenation: Pre-oxygenation with 100% oxygen ?Induction Type: IV induction ?Ventilation: Mask ventilation without difficulty ?Laryngoscope Size: Mac and 3 ?Grade View: Grade I ?Tube type: Oral ?Tube size: 7.0 mm ?Number of attempts: 1 ?Airway Equipment and Method: Stylet and Oral airway ?Placement Confirmation: ETT inserted through vocal cords under direct vision, positive ETCO2 and breath sounds checked- equal and bilateral ?Secured at: 21 cm ?Tube secured with: Tape ?Dental Injury: Teeth and Oropharynx as per pre-operative assessment  ? ? ? ? ?

## 2021-06-20 NOTE — Plan of Care (Signed)

## 2021-06-20 NOTE — Anesthesia Postprocedure Evaluation (Signed)
Anesthesia Post Note ? ?Patient: Lisa Bartlett ? ?Procedure(s) Performed: ENDOSCOPIC RETROGRADE CHOLANGIOPANCREATOGRAPHY (ERCP) ?SPHINCTEROTOMY ? ?  ? ?Patient location during evaluation: PACU ?Anesthesia Type: General ?Level of consciousness: awake and alert, patient cooperative and oriented ?Pain management: pain level controlled ?Vital Signs Assessment: post-procedure vital signs reviewed and stable ?Respiratory status: spontaneous breathing, nonlabored ventilation and respiratory function stable ?Cardiovascular status: blood pressure returned to baseline and stable ?Postop Assessment: no apparent nausea or vomiting ?Anesthetic complications: no ? ? ?No notable events documented. ? ?Last Vitals:  ?Vitals:  ? 06/20/21 1200 06/20/21 1215  ?BP: 130/82 126/79  ?Pulse: 72 70  ?Resp: 17 15  ?Temp:  36.9 ?C  ?SpO2: 100% 96%  ?  ?Last Pain:  ?Vitals:  ? 06/20/21 1215  ?TempSrc:   ?PainSc: 0-No pain  ? ? ?  ?  ?  ?  ?  ?  ? ?Jolin Benavides,E. Exie Chrismer ? ? ? ? ?

## 2021-06-20 NOTE — Anesthesia Preprocedure Evaluation (Addendum)
Anesthesia Evaluation  ?Patient identified by MRN, date of birth, ID band ?Patient awake ? ? ? ?Reviewed: ?Allergy & Precautions, NPO status , Patient's Chart, lab work & pertinent test results ? ?History of Anesthesia Complications ?Negative for: history of anesthetic complications ? ?Airway ?Mallampati: I ? ?TM Distance: >3 FB ?Neck ROM: Full ? ? ? Dental ? ?(+) Dental Advisory Given, Teeth Intact ?  ?Pulmonary ?neg pulmonary ROS,  ?  ?breath sounds clear to auscultation ? ? ? ? ? ? Cardiovascular ?negative cardio ROS ? ? ?Rhythm:Regular Rate:Normal ? ? ?  ?Neuro/Psych ?negative neurological ROS ?   ? GI/Hepatic ?Elevated LFTs ?S/p cholecystectomy ?  ?Endo/Other  ?negative endocrine ROS ? Renal/GU ?negative Renal ROS  ? ?  ?Musculoskeletal ? ? Abdominal ?  ?Peds ? Hematology ?negative hematology ROS ?(+)   ?Anesthesia Other Findings ? ? Reproductive/Obstetrics ?Nexplanon ? ?  ? ? ? ? ? ? ? ? ? ? ? ? ? ?  ?  ? ? ? ? ? ? ? ?Anesthesia Physical ?Anesthesia Plan ? ?ASA: 2 ? ?Anesthesia Plan: General  ? ?Post-op Pain Management: Minimal or no pain anticipated  ? ?Induction: Intravenous ? ?PONV Risk Score and Plan: 3 and Ondansetron, Dexamethasone and Scopolamine patch - Pre-op ? ?Airway Management Planned: Oral ETT ? ?Additional Equipment: None ? ?Intra-op Plan:  ? ?Post-operative Plan: Extubation in OR ? ?Informed Consent: I have reviewed the patients History and Physical, chart, labs and discussed the procedure including the risks, benefits and alternatives for the proposed anesthesia with the patient or authorized representative who has indicated his/her understanding and acceptance.  ? ? ? ?Dental advisory given ? ?Plan Discussed with: CRNA and Surgeon ? ?Anesthesia Plan Comments:   ? ? ? ? ? ?Anesthesia Quick Evaluation ? ?

## 2021-06-20 NOTE — Interval H&P Note (Signed)
History and Physical Interval Note: ? ?06/20/2021 ?10:29 AM ? ?Lisa Bartlett  has presented today for surgery, with the diagnosis of Choledocholithiasis.  The various methods of treatment have been discussed with the patient and family. After consideration of risks, benefits and other options for treatment, the patient has consented to  Procedure(s): ?ENDOSCOPIC RETROGRADE CHOLANGIOPANCREATOGRAPHY (ERCP) (N/A) as a surgical intervention.  The patient's history has been reviewed, patient examined, no change in status, stable for surgery.  I have reviewed the patient's chart and labs.  Questions were answered to the patient's satisfaction.   ? ? ?Adrick Kestler D ? ? ?

## 2021-06-20 NOTE — Transfer of Care (Signed)
Immediate Anesthesia Transfer of Care Note ? ?Patient: Lisa Bartlett ? ?Procedure(s) Performed: ENDOSCOPIC RETROGRADE CHOLANGIOPANCREATOGRAPHY (ERCP) ?SPHINCTEROTOMY ? ?Patient Location: PACU ? ?Anesthesia Type:General ? ?Level of Consciousness: awake ? ?Airway & Oxygen Therapy: Patient Spontanous Breathing ? ?Post-op Assessment: Report given to RN and Post -op Vital signs reviewed and stable ? ?Post vital signs: Reviewed and stable ? ?Last Vitals:  ?Vitals Value Taken Time  ?BP 118/69 06/20/21 1146  ?Temp    ?Pulse 100 06/20/21 1147  ?Resp 20 06/20/21 1147  ?SpO2 98 % 06/20/21 1147  ?Vitals shown include unvalidated device data. ? ?Last Pain:  ?Vitals:  ? 06/20/21 0958  ?TempSrc: Temporal  ?PainSc: 0-No pain  ?   ? ?Patients Stated Pain Goal: 2 (06/19/21 [id number]) ? ?Complications: No notable events documented. ?

## 2021-06-20 NOTE — Op Note (Signed)
[facility] ?Patient Name: Lisa Bartlett ?Procedure Date : 06/20/2021 ?MRN: [id number] ?Attending MD: Jeani Hawking , MD ?Date of Birth: [birth year] ?CSN: [id number] ?Age: 21 ?Admit Type: Inpatient ?Procedure:                ERCP ?Indications:              Common bile duct stone(s) ?Providers:                Jeani Hawking, MD, Roselie Awkward, RN, Janie Billups,  ?                          Technician ?Referring MD:              ?Medicines:                General Anesthesia and indomethacin 100 mg PR, 1  ?                          liter of normal saline. ?Complications:            No immediate complications. ?Estimated Blood Loss:     Estimated blood loss: none. ?Procedure:                Pre-Anesthesia Assessment: ?                          - Prior to the procedure, a History and Physical  ?                          was performed, and patient medications and  ?                          allergies were reviewed. The patient's tolerance of  ?                          previous anesthesia was also reviewed. The risks  ?                          and benefits of the procedure and the sedation  ?                          options and risks were discussed with the patient.  ?                          All questions were answered, and informed consent  ?                          was obtained. Prior Anticoagulants: The patient has  ?                          taken no previous anticoagulant or antiplatelet  ?                          agents. ASA Grade Assessment: I - A normal, healthy  ?  patient. After reviewing the risks and benefits,  ?                          the patient was deemed in satisfactory condition to  ?                          undergo the procedure. ?                          - Sedation was administered by an anesthesia  ?                          professional. Deep sedation was attained. ?                          After obtaining informed consent, the scope was  ?                           passed under direct vision. Throughout the  ?                          procedure, the patient's blood pressure, pulse, and  ?                          oxygen saturations were monitored continuously. The  ?                          TJF-Q190V ([id number]) Olympus duodenoscope was  ?                          introduced through the mouth, and used to inject  ?                          contrast into and used to inject contrast into the  ?                          bile duct and ventral pancreatic duct. The ERCP was  ?                          somewhat difficult. The patient tolerated the  ?                          procedure well. ?Scope In: ?Scope Out: ?Findings: ?     The major papilla was normal. The minor papilla was normal. The bile  ?     duct was deeply cannulated with the short-nosed traction sphincterotome.  ?     Contrast was injected. I personally interpreted the bile duct images.  ?     There was brisk flow of contrast through the ducts. Image quality was  ?     excellent. Contrast extended to the bifurcation. The common bile duct  ?     and hepatic duct bifurcation were normal. A short 0.035 inch Soft  ?     Al Pimple was passed into the biliary tree. A 10 mm biliary sphincterotomy  ?  was made with a traction (standard) sphincterotome using ERBE  ?     electrocautery. There was no post-sphincterotomy bleeding. The biliary  ?     tree was swept with a 15 mm balloon starting at the bifurcation. Nothing  ?     was found. ?     The patient had a large papilla. With the cannulation attempts the PD  ?     was cannulated in the proximal portion. A two wire technique was used,  ?     but the wire fell out of place. With the ampulla engaged a  ?     sphincterotomy was created. This increased the flow of bile.  ?     Repositioning allowed for easy cannulation of the CBD. The guidewire was  ?     secured in the right intrahepatic ducts. Contrast injection showed a  ?     mildly dilated CBD at 9 mm. There was no  evidence of any obvious filling  ?     defect. The sphincterotomy was augmented and then the CBD was swept  ?     three times with the 12 mm diameter and once with the 15 mm diameter. No  ?     stones or any other abnormalities were removed. There was excellent flow  ?     of bile. The procedure was terminated and the fluoroscopic images were  ?     saved. ?Impression:               - The major papilla appeared normal. ?                          - The minor papilla appeared normal. ?                          - A biliary sphincterotomy was performed. ?                          - The biliary tree was swept and nothing was found. ?Recommendation:           - Return patient to hospital ward for ongoing care. ?                          - Clear liquid diet. ?Procedure Code(s):        --- Professional --- ?                          [id number], Endoscopic retrograde  ?                          cholangiopancreatography (ERCP); with  ?                          sphincterotomy/papillotomy ?                          [id number], Endoscopic catheterization of the biliary  ?                          ductal system, radiological supervision and  ?  interpretation ?Diagnosis Code(s):        --- Professional --- ?                          K80.50, Calculus of bile duct without cholangitis  ?                          or cholecystitis without obstruction ?CPT copyright 2019 [employer]. All rights reserved. ?The codes documented in this report are preliminary and upon coder review may  ?be revised to meet current compliance requirements. ?Jeani Hawking, MD ?Jeani Hawking, MD ?06/20/2021 11:41:02 AM ?This report has been signed electronically. ?Number of Addenda: 0 ?

## 2021-06-20 NOTE — Progress Notes (Signed)
?Day of Surgery  ? ?Chief Complaint/Subjective: ?ERCP successful earlier today, no pain, no nausea ? ?Objective: ?Vital signs in last 24 hours: ?Temp:  [97.7 ?F (36.5 ?C)-98.6 ?F (37 ?C)] 98.6 ?F (37 ?C) (04/08 1607) ?Pulse Rate:  [66-105] 71 (04/08 1607) ?Resp:  [15-20] 18 (04/08 1607) ?BP: (112-143)/(64-105) 122/72 (04/08 1607) ?SpO2:  [95 %-100 %] 99 % (04/08 1607) ?Last BM Date : 06/18/21 ?Intake/Output from previous day: ?No intake/output data recorded. ?Intake/Output this shift: ?Total I/O ?In: 800 [I.V.:800] ?Out: 0  ? ?PE: ?Gen: NAD ?Resp: nonlabored ?Card: RRR ?Abd: soft, incisions c/d/I, nontender ? ?Lab Results:  ?Recent Labs  ?  06/18/21 ?2116  ?WBC 13.5*  ?HGB 13.1  ?HCT 38.2  ?PLT 294  ? ?BMET ?Recent Labs  ?  06/18/21 ?2116 06/19/21 ?0055  ?NA 134* 134*  ?K 3.9 4.4  ?CL 106 105  ?CO2 20* 20*  ?GLUCOSE 97 116*  ?BUN 8 8  ?CREATININE 0.69 0.66  ?CALCIUM 8.8* 8.9  ? ?PT/INR ?No results for input(s): LABPROT, INR in the last 72 hours. ?CMP  ?   ?Component Value Date/Time  ? NA 134 (L) 06/19/2021 0055  ? K 4.4 06/19/2021 0055  ? CL 105 06/19/2021 0055  ? CO2 20 (L) 06/19/2021 0055  ? GLUCOSE 116 (H) 06/19/2021 0055  ? BUN 8 06/19/2021 0055  ? CREATININE 0.66 06/19/2021 0055  ? CALCIUM 8.9 06/19/2021 0055  ? PROT 6.6 06/19/2021 0055  ? ALBUMIN 3.4 (L) 06/19/2021 0055  ? AST 48 (H) 06/19/2021 0055  ? ALT 137 (H) 06/19/2021 0055  ? ALKPHOS 122 06/19/2021 0055  ? BILITOT 1.1 06/19/2021 0055  ? GFRNONAA >60 06/19/2021 0055  ? ?Lipase  ?   ?Component Value Date/Time  ? LIPASE 52 (H) 06/15/2021 0458  ? ? ?Studies/Results: ?DG Cholangiogram Operative ? ?Result Date: 06/18/2021 ?CLINICAL DATA:  Cholecystectomy for symptomatic cholelithiasis. EXAM: INTRAOPERATIVE CHOLANGIOGRAM TECHNIQUE: Cholangiographic images from the C-arm fluoroscopic device were submitted for interpretation post-operatively. Please see the procedural report for the amount of contrast and the fluoroscopy time utilized. FLUOROSCOPY: Radiation  Exposure Index (as provided by the fluoroscopic device): 6.4 mGy Kerma COMPARISON:  MRI on 06/15/2021 FINDINGS: Intraoperative imaging with a C-arm demonstrates smooth ovoid filling defects within the common bile duct with a larger persistent filling defects seen on 2 contrast injections and some smaller filling defects inferior to the larger defect. The common bile duct itself is not significantly dilated with some potential mild dilatation of the proper hepatic duct. Contrast enters the duodenum normally. IMPRESSION: Evidence of choledocholithiasis with persistent filling defects identified in the common bile duct. Electronically Signed   By: Irish Lack M.D.   On: 06/18/2021 18:40  ? ?DG ERCP ? ?Result Date: 06/20/2021 ?CLINICAL DATA:  Choledocholithiasis on intra op cholangiogram EXAM: ERCP TECHNIQUE: Multiple spot images obtained with the fluoroscopic device and submitted for interpretation post-procedure. COMPARISON:  06/18/2021 FINDINGS: A series of fluoroscopic spot images document endoscopic cannulation and opacification of the CBD. The intrahepatic biliary tree is incompletely opacified, appearing relatively decompressed centrally. Cholecystectomy clips are noted. No extravasation identified. IMPRESSION: Endoscopic CBD intervention.  See operators note for details. These images were submitted for radiologic interpretation only. Please see the procedural report for the amount of contrast and the fluoroscopy time utilized. Electronically Signed   By: Corlis Leak M.D.   On: 06/20/2021 14:09   ? ?Anti-infectives: ?Anti-infectives (From admission, onward)  ? ? Start     Dose/Rate Route Frequency Ordered Stop  ? 06/19/21  0600  ceFAZolin (ANCEF) IVPB 2g/100 mL premix       ? 2 g ?200 mL/hr over 30 Minutes Intravenous On call to O.R. 06/18/21 1239 06/18/21 1655  ? 06/18/21 1251  ceFAZolin (ANCEF) 2-4 GM/100ML-% IVPB       ?Note to Pharmacy: Kandice Hams D: cabinet override  ?    06/18/21 1251 06/18/21 1704   ? ?  ? ? ?Assessment/Plan ? s/p Procedure(s): ?ENDOSCOPIC RETROGRADE CHOLANGIOPANCREATOGRAPHY (ERCP) ?SPHINCTEROTOMY 06/20/2021  ? ?FEN - advance diet ?VTE - scds ?ID - no issues ?Disposition - home tomorrow ? ? LOS: 1 day  ? ?I reviewed last 24 h vitals and pain scores, last 48 h intake and output, last 24 h labs and trends, and last 24 h imaging results. ? ?This care required moderate level of medical decision making.  ? ?De Blanch Genaro Bekker ?[facility] ?06/20/2021, 4:21 PM ?Please see Amion for pager number during day hours 7:00am-4:30pm or 7:00am -11:30am on weekends ? ? ?

## 2021-06-21 MED ORDER — ONDANSETRON HCL 4 MG PO TABS: 4.0000 mg | ORAL_TABLET | Freq: Three times a day (TID) | ORAL | 5 refills | Status: AC | PRN

## 2021-06-21 MED ORDER — HYDROCODONE-ACETAMINOPHEN 5-325 MG PO TABS: 1.0000 | ORAL_TABLET | Freq: Four times a day (QID) | ORAL | 0 refills | Status: AC | PRN

## 2021-06-21 NOTE — Progress Notes (Signed)
Patient was discharged to home, AVS reviewed, IV removed, prescriptions sent to the patient's pharmacy. Her boyfriend provided transportation ?

## 2021-06-21 NOTE — Progress Notes (Signed)
Subjective: ?Feeling well.  No complaints of pain. ? ?Objective: ?Vital signs in last 24 hours: ?Temp:  [98.3 ?F (36.8 ?C)-99 ?F (37.2 ?C)] 99 ?F (37.2 ?C) (04/09 1324) ?Pulse Rate:  [58-81] 72 (04/09 0926) ?Resp:  [17-18] 17 (04/09 0926) ?BP: (111-122)/(56-76) 114/62 (04/09 4010) ?SpO2:  [97 %-99 %] 99 % (04/09 0926) ?Last BM Date : 06/20/21 ? ?Intake/Output from previous day: ?04/08 0701 - 04/09 0700 ?In: 1040 [P.O.:240; I.V.:800] ?Out: 0  ?Intake/Output this shift: ?Total I/O ?In: 220 [P.O.:220] ?Out: -  ? ?General appearance: alert and no distress ?GI: soft, non-tender; bowel sounds normal; no masses,  no organomegaly ? ?Lab Results: ?Recent Labs  ?  06/18/21 ?2116  ?WBC 13.5*  ?HGB 13.1  ?HCT 38.2  ?PLT 294  ? ?BMET ?Recent Labs  ?  06/18/21 ?2116 06/19/21 ?0055  ?NA 134* 134*  ?K 3.9 4.4  ?CL 106 105  ?CO2 20* 20*  ?GLUCOSE 97 116*  ?BUN 8 8  ?CREATININE 0.69 0.66  ?CALCIUM 8.8* 8.9  ? ?LFT ?Recent Labs  ?  06/19/21 ?0055  ?PROT 6.6  ?ALBUMIN 3.4*  ?AST 48*  ?ALT 137*  ?ALKPHOS 122  ?BILITOT 1.1  ? ?PT/INR ?No results for input(s): LABPROT, INR in the last 72 hours. ?Hepatitis Panel ?No results for input(s): HEPBSAG, HCVAB, HEPAIGM, HEPBIGM in the last 72 hours. ?C-Diff ?No results for input(s): CDIFFTOX in the last 72 hours. ?Fecal Lactopherrin ?No results for input(s): FECLLACTOFRN in the last 72 hours. ? ?Studies/Results: ?DG ERCP ? ?Result Date: 06/20/2021 ?CLINICAL DATA:  Choledocholithiasis on intra op cholangiogram EXAM: ERCP TECHNIQUE: Multiple spot images obtained with the fluoroscopic device and submitted for interpretation post-procedure. COMPARISON:  06/18/2021 FINDINGS: A series of fluoroscopic spot images document endoscopic cannulation and opacification of the CBD. The intrahepatic biliary tree is incompletely opacified, appearing relatively decompressed centrally. Cholecystectomy clips are noted. No extravasation identified. IMPRESSION: Endoscopic CBD intervention.  See operators note for  details. These images were submitted for radiologic interpretation only. Please see the procedural report for the amount of contrast and the fluoroscopy time utilized. Electronically Signed   By: Corlis Leak M.D.   On: 06/20/2021 14:09   ? ?Medications: Scheduled: ? acetaminophen  1,000 mg Oral Q6H  ? docusate sodium  100 mg Oral BID  ? scopolamine  1 patch Transdermal Q72H  ? ?Continuous: ? ?Assessment/Plan: ?1) S/p ERCP without complications. ?2) S/p lap chole. ? ? There was no evidence of a post-ERCP pancreatitis.   ? ?Plan: ?1) Okay with D/C home. ? LOS: 2 days  ? ?Inika Bellanger D ?06/21/2021, 12:20 PM  ?

## 2021-06-21 NOTE — Discharge Summary (Signed)
Physician Discharge Summary  ?Patient ID: ?Lisa Bartlett ?MRN: [id number] ?DOB/AGE: Feb 26, 2001 20 y.o. ? ?PCP: Pcp, No ? ?Admit date: 06/18/2021 ?Discharge date: 06/21/2021 ? ?Admission Diagnoses:  cholecystitis ? ?Discharge Diagnoses:  same  ?Principal Problem: ?  Choledocholithiasis ? ? ?Surgery:  lap chole with IOC, ERCP ? ?Discharged Condition: improved ? ?Hospital Course:   Had surgery by Dr. Freida Busman with positive IOC for stones.  ERCP on 4/8 by Dr. Elnoria Howard showed clear bile ducts and a sphincterotomy was performed.   ? ?Consults: Dr. Elnoria Howard ? ?Significant Diagnostic Studies: ERCP, IOC ? ? ? ?Discharge Exam: ?Blood pressure 114/62, pulse 72, temperature 99 ?F (37.2 ?C), temperature source Oral, resp. rate 17, height 5\' 1"  (1.549 m), weight 69.4 kg, last menstrual period 05/07/2021, SpO2 99 %. ?Incisions OK ? ?Disposition: Discharge disposition: 01-Home or Self Care ? ? ? ? ? ? ?Discharge Instructions   ? ? Call MD for:  persistant nausea and vomiting   Complete by: As directed ?  ? Diet - low sodium heart healthy   Complete by: As directed ?  ? Discharge instructions   Complete by: As directed ?  ? Advance diet as tolerated  ? Increase activity slowly   Complete by: As directed ?  ? ?  ? ?Allergies as of 06/21/2021   ?No Known Allergies ?  ? ?  ?Medication List  ?  ? ?TAKE these medications   ? ?acetaminophen 500 MG tablet ?Commonly known as: TYLENOL ?Take 500-1,000 mg by mouth daily as needed (pain/menstrual cramps). ?  ?HYDROcodone-acetaminophen 5-325 MG tablet ?Commonly known as: NORCO/VICODIN ?Take 1 tablet by mouth every 6 (six) hours as needed for moderate pain. ?  ?Nexplanon 68 MG Impl implant ?Generic drug: etonogestrel ?1 each by Subdermal route once. Implanted April 28, 2021 ?  ?ondansetron 4 MG tablet ?Commonly known as: ZOFRAN ?Take 1 tablet (4 mg total) by mouth every 8 (eight) hours as needed for nausea. ?  ? ?  ? ? Follow-up Information   ? ? April 30, 2021, MD. Schedule an appointment as soon as  possible for a visit in 3 week(s).   ?Specialty: General Surgery ?Why: for followup after surgery ?Contact information: ?[address] ?[address] ?[place] ?[phone] ? ? ?  ?  ? ?  ?  ? ?  ? ? ?Signed: ?[phone] ?06/21/2021, 10:51 AM ?  ?

## 2021-06-22 ENCOUNTER — Encounter (HOSPITAL_COMMUNITY): Payer: Self-pay | Admitting: Gastroenterology

## 2021-06-22 LAB — SURGICAL PATHOLOGY
